# Patient Record
Sex: Female | Born: 1935 | Race: White | Hispanic: No | Marital: Single | Smoking: Former smoker
Health system: Southern US, Community
[De-identification: ages and names within clinical notes are randomized; demographics above are authoritative.]

## PROBLEM LIST (undated history)

## (undated) DIAGNOSIS — M1990 Unspecified osteoarthritis, unspecified site: Secondary | ICD-10-CM

## (undated) DIAGNOSIS — F0280 Dementia in other diseases classified elsewhere without behavioral disturbance: Secondary | ICD-10-CM

## (undated) DIAGNOSIS — G301 Alzheimer's disease with late onset: Secondary | ICD-10-CM

## (undated) DIAGNOSIS — G2581 Restless legs syndrome: Secondary | ICD-10-CM

## (undated) DIAGNOSIS — I10 Essential (primary) hypertension: Secondary | ICD-10-CM

## (undated) DIAGNOSIS — D46Z Other myelodysplastic syndromes: Secondary | ICD-10-CM

## (undated) DIAGNOSIS — K219 Gastro-esophageal reflux disease without esophagitis: Secondary | ICD-10-CM

## (undated) DIAGNOSIS — F319 Bipolar disorder, unspecified: Secondary | ICD-10-CM

## (undated) DIAGNOSIS — D61818 Other pancytopenia: Secondary | ICD-10-CM

---

## 2018-05-22 ENCOUNTER — Other Ambulatory Visit: Payer: Self-pay

## 2018-05-22 ENCOUNTER — Encounter: Payer: Self-pay | Admitting: Emergency Medicine

## 2018-05-22 ENCOUNTER — Emergency Department
Admission: EM | Admit: 2018-05-22 | Discharge: 2018-05-22 | Disposition: A | Discharge status: Home / Self Care | Payer: Medicare Other | Attending: Emergency Medicine | Admitting: Emergency Medicine

## 2018-05-22 DIAGNOSIS — R7989 Other specified abnormal findings of blood chemistry: Secondary | ICD-10-CM | POA: Diagnosis present

## 2018-05-22 DIAGNOSIS — I10 Essential (primary) hypertension: Secondary | ICD-10-CM | POA: Insufficient documentation

## 2018-05-22 DIAGNOSIS — D649 Anemia, unspecified: Secondary | ICD-10-CM | POA: Insufficient documentation

## 2018-05-22 DIAGNOSIS — Z79899 Other long term (current) drug therapy: Secondary | ICD-10-CM | POA: Diagnosis not present

## 2018-05-22 DIAGNOSIS — Z87891 Personal history of nicotine dependence: Secondary | ICD-10-CM | POA: Diagnosis not present

## 2018-05-22 DIAGNOSIS — G309 Alzheimer's disease, unspecified: Secondary | ICD-10-CM | POA: Insufficient documentation

## 2018-05-22 HISTORY — DX: Other pancytopenia: D61.818

## 2018-05-22 HISTORY — DX: Essential (primary) hypertension: I10

## 2018-05-22 HISTORY — DX: Bipolar disorder, unspecified: F31.9

## 2018-05-22 HISTORY — DX: Unspecified osteoarthritis, unspecified site: M19.90

## 2018-05-22 HISTORY — DX: Gastro-esophageal reflux disease without esophagitis: K21.9

## 2018-05-22 HISTORY — DX: Alzheimer's disease with late onset: G30.1

## 2018-05-22 HISTORY — DX: Restless legs syndrome: G25.81

## 2018-05-22 HISTORY — DX: Dementia in other diseases classified elsewhere, unspecified severity, without behavioral disturbance, psychotic disturbance, mood disturbance, and anxiety: F02.80

## 2018-05-22 HISTORY — DX: Other myelodysplastic syndromes: D46.Z

## 2018-05-22 LAB — COMPREHENSIVE METABOLIC PANEL
ALT: 10 U/L (ref 0–44)
ANION GAP: 8 (ref 5–15)
AST: 14 U/L — ABNORMAL LOW (ref 15–41)
Albumin: 3.5 g/dL (ref 3.5–5.0)
Alkaline Phosphatase: 55 U/L (ref 38–126)
BUN: 22 mg/dL (ref 8–23)
CO2: 22 mmol/L (ref 22–32)
Calcium: 9.2 mg/dL (ref 8.9–10.3)
Chloride: 109 mmol/L (ref 98–111)
Creatinine, Ser: 1.26 mg/dL — ABNORMAL HIGH (ref 0.44–1.00)
GFR, EST AFRICAN AMERICAN: 47 mL/min — AB (ref >60)
GFR, EST NON AFRICAN AMERICAN: 40 mL/min — AB (ref >60)
Glucose, Bld: 154 mg/dL — ABNORMAL HIGH (ref 70–99)
Potassium: 4 mmol/L (ref 3.5–5.1)
Sodium: 139 mmol/L (ref 135–145)
Total Bilirubin: 0.6 mg/dL (ref 0.3–1.2)
Total Protein: 6.1 g/dL — ABNORMAL LOW (ref 6.5–8.1)

## 2018-05-22 LAB — CBC WITH DIFFERENTIAL/PLATELET
Abs Immature Granulocytes: 0.01 10*3/uL (ref 0.00–0.07)
Basophils Absolute: 0 10*3/uL (ref 0.0–0.1)
Basophils Relative: 0 %
EOS ABS: 0 10*3/uL (ref 0.0–0.5)
Eosinophils Relative: 2 %
HCT: 21.4 % — ABNORMAL LOW (ref 36.0–46.0)
Hemoglobin: 6.7 g/dL — ABNORMAL LOW (ref 12.0–15.0)
Immature Granulocytes: 1 %
Lymphocytes Relative: 23 %
Lymphs Abs: 0.4 10*3/uL — ABNORMAL LOW (ref 0.7–4.0)
MCH: 37 pg — ABNORMAL HIGH (ref 26.0–34.0)
MCHC: 31.3 g/dL (ref 30.0–36.0)
MCV: 118.2 fL — ABNORMAL HIGH (ref 80.0–100.0)
Monocytes Absolute: 0.2 10*3/uL (ref 0.1–1.0)
Monocytes Relative: 9 %
NRBC: 0 % (ref 0.0–0.2)
Neutro Abs: 1.2 10*3/uL — ABNORMAL LOW (ref 1.7–7.7)
Neutrophils Relative %: 65 %
Platelets: 94 10*3/uL — ABNORMAL LOW (ref 150–400)
RBC: 1.81 MIL/uL — ABNORMAL LOW (ref 3.87–5.11)
RDW: 17.3 % — ABNORMAL HIGH (ref 11.5–15.5)
WBC: 1.9 10*3/uL — ABNORMAL LOW (ref 4.0–10.5)

## 2018-05-22 LAB — APTT: APTT: 38 s — AB (ref 24–36)

## 2018-05-22 LAB — PROTIME-INR
INR: 0.96
Prothrombin Time: 12.7 seconds (ref 11.4–15.2)

## 2018-05-22 LAB — PREPARE RBC (CROSSMATCH)

## 2018-05-22 LAB — ABO/RH: ABO/RH(D): O POS

## 2018-05-22 LAB — TROPONIN I: Troponin I: <0.03 ng/mL (ref <0.03)

## 2018-05-22 MED ORDER — SODIUM CHLORIDE 0.9 % IV SOLN
10.0000 mL/h | Freq: Once | INTRAVENOUS | Status: AC
  Administered 2018-05-22: 10 mL/h via INTRAVENOUS

## 2018-05-22 NOTE — ED Notes (Signed)
Pt assisted to toilet by another RN. Pt tolerated well.

## 2018-05-22 NOTE — ED Notes (Signed)
Pt tolerating blood transfusion well at this time, no reports of rash, itching or shortness of breath, lungs clear, family at bedside.  Continue to monitor.

## 2018-05-22 NOTE — ED Provider Notes (Signed)
[facility] Emergency Department Provider Note   ____________________________________________    I have reviewed the triage vital signs and the nursing notes.   HISTORY  Chief Complaint Abnormal Lab (Hemoglobin 6.5)     HPI Holly Novak is a 82 y.o. female with a history of Alzheimer's dementia, pancytopenia who was sent from facility for evaluation of low hemoglobin.  Patient reports overall she feels well.  She denies chest pain shortness of breath.  No abdominal pain.  She notes stools have been slightly darker than usual.  She is not on blood thinners per MAR.  Denies nausea or vomiting.  No falls or trauma.  No old records available at this time   Past Medical History:  Diagnosis Date  . Arthritis   . Bipolar 1 disorder ([place])   . GERD (gastroesophageal reflux disease)   . High grade myelodysplastic syndrome ([place])   . Hypertension   . Late onset Alzheimer disease ([place])   . Pancytopenia ([place])   . Restless leg syndrome     There are no active problems to display for this patient.   History reviewed. No pertinent surgical history.  Prior to Admission medications   Medication Sig Start Date End Date Taking? Authorizing Provider  acetaminophen (TYLENOL) 500 MG tablet Take 500 mg by mouth every 8 (eight) hours as needed (for pain in knees).   Yes [provider]  alum & mag hydroxide-simeth (MAALOX PLUS) 400-400-40 MG/5ML suspension Take 30 mLs by mouth as needed for indigestion.   Yes [provider]  ammonium lactate (LAC-HYDRIN) 12 % lotion Apply 1 application topically as needed for dry skin. Bilateral legs every day   Yes [provider]  bisacodyl (FLEET) 10 MG/30ML ENEM Place 10 mg rectally once.   Yes [provider]  bismuth subsalicylate (PEPTO BISMOL) 262 MG/15ML suspension Take 15 mLs by mouth every 6 (six) hours as needed. Take 15 ml by mouth every 2 hours as needed for up to 6 doses in 24  hours   Yes [provider]  calcium carbonate (OSCAL) 1500 (600 Ca) MG TABS tablet Take 600 mg of elemental calcium by mouth 2 (two) times daily with a meal.   Yes [provider]  Cholecalciferol (VITAMIN D3) 50 MCG (2000 UT) TABS Take 1 tablet by mouth daily.   Yes [provider]  Cyanocobalamin (VITAMIN B-12) 2500 MCG SUBL Place 1 tablet under the tongue daily.   Yes [provider]  docusate sodium (COLACE) 100 MG capsule Take 100 mg by mouth 2 (two) times daily.   Yes [provider]  guaiFENesin (ROBITUSSIN) 100 MG/5ML liquid Take 10 mLs by mouth 4 (four) times daily as needed for cough.   Yes [provider]  loperamide (IMODIUM A-D) 2 MG tablet Take 2 mg by mouth 4 (four) times daily as needed for diarrhea or loose stools. Take one tablet by mouth after each loose stool as needed for up to 4 doses in 12 hours   Yes [provider]  LORazepam (ATIVAN) 0.5 MG tablet Take 0.5 mg by mouth at bedtime. As needed for insomnia   Yes [provider]  LORazepam (ATIVAN) 0.5 MG tablet Take 0.5 mg by mouth 2 (two) times daily.   Yes [provider]  magnesium hydroxide (MILK OF MAGNESIA) 400 MG/5ML suspension Take 30 mLs by mouth daily as needed for mild constipation.   Yes [provider]  Magnesium Oxide 250 MG TABS Take  250 mg by mouth daily.   Yes [provider]  Melatonin 3 MG TABS Take 3 mg by mouth at bedtime.   Yes [provider]  metoprolol tartrate (LOPRESSOR) 50 MG tablet Take 50 mg by mouth 2 (two) times daily.   Yes [provider]  OLANZapine (ZYPREXA) 10 MG tablet Take 10 mg by mouth at bedtime. Take one tablet (with one 20 mg tablet for total 30 mg) by mouth at bedtime   Yes [provider]  OLANZapine (ZYPREXA) 20 MG tablet Take 20 mg by mouth at bedtime. Take one tablet (20 mg with 10 mg tablet for total 30 mg) by mouth at bedtime   Yes [provider]   omeprazole (PRILOSEC) 40 MG capsule Take 40 mg by mouth daily. Take one capsule by mouth 30 minutes before breakfast (may open capsule and sprinkle contents, do not crush or chew)   Yes [provider]  rOPINIRole (REQUIP) 0.5 MG tablet Take 0.5 mg by mouth at bedtime.   Yes [provider]  Skin Protectants, Misc. (EUCERIN) cream Apply 1 application topically as needed for dry skin. Apply to both legs daily   Yes [provider]  sodium phosphate (FLEET) 7-19 GM/118ML ENEM Place 1 enema rectally once.   Yes [provider]     Allergies Patient has no known allergies.  No family history on file.  Social History Social History   Tobacco Use  . Smoking status: Former Research scientist (life sciences)  . Smokeless tobacco: Never Used  Substance Use Topics  . Alcohol use: Not Currently  . Drug use: Not Currently    Review of Systems  Constitutional: No fever/chills Eyes: No visual changes.  ENT: No sore throat. Cardiovascular: Denies chest pain. Respiratory: Denies shortness of breath. Gastrointestinal: As above Genitourinary: Negative for dysuria. Musculoskeletal: Negative for back pain. Skin: Negative for rash. Neurological: Negative for headaches    ____________________________________________   PHYSICAL EXAM:  VITAL SIGNS: ED Triage Vitals  Enc Vitals Group     BP 05/22/18 0844 (!) 143/53     Pulse Rate 05/22/18 0900 71     Resp 05/22/18 0844 18     Temp 05/22/18 0844 98.5 F (36.9 C)     Temp Source 05/22/18 0844 Oral     SpO2 05/22/18 0844 97 %     Weight 05/22/18 0842 63.5 kg (140 lb)     Height 05/22/18 0842 1.575 m (5\' 2" )     Head Circumference --      Peak Flow --      Pain Score 05/22/18 0842 0     Pain Loc --      Pain Edu? --      Excl. in [place]? --     Constitutional: Alert No acute distress. Pleasant and interactive Eyes: Conjunctivae are normal.   Nose: No congestion/rhinnorhea. Mouth/Throat: Mucous membranes are moist.     Cardiovascular: Normal rate, regular rhythm. Grossly normal heart sounds.  Good peripheral circulation. Respiratory: Normal respiratory effort.  No retractions. Lungs CTAB. Gastrointestinal: Soft and nontender. No distention.  No CVA tenderness.  Guaiac negative brown stool  Musculoskeletal: .  Warm and well perfused Neurologic:  Normal speech and language. No gross focal neurologic deficits are appreciated.  Skin:  Skin is warm, dry and intact. No rash noted. Psychiatric: Mood and affect are normal. Speech and behavior are normal.  ____________________________________________   LABS (all labs ordered are listed, but only abnormal results are displayed)  Labs Reviewed  APTT - Abnormal; Notable for the following components:      Result Value   aPTT 38 (*)    All other components within normal limits  COMPREHENSIVE METABOLIC PANEL - Abnormal; Notable for the following components:   Glucose, Bld 154 (*)    Creatinine, Ser 1.26 (*)    Total Protein 6.1 (*)    AST 14 (*)    GFR calc non Af Amer 40 (*)    GFR calc Af Amer 47 (*)    All other components within normal limits  CBC WITH DIFFERENTIAL/PLATELET - Abnormal; Notable for the following components:   WBC 1.9 (*)    RBC 1.81 (*)    Hemoglobin 6.7 (*)    HCT 21.4 (*)    MCV 118.2 (*)    MCH 37.0 (*)    RDW 17.3 (*)    Platelets 94 (*)    Neutro Abs 1.2 (*)    Lymphs Abs 0.4 (*)    All other components within normal limits  PROTIME-INR  TROPONIN I  TYPE AND SCREEN  PREPARE RBC (CROSSMATCH)  ABO/RH   ____________________________________________  EKG  ED ECG REPORT I, Lavonia Drafts, the attending physician, personally viewed and interpreted this ECG.  Date: 05/22/2018  Rhythm: normal sinus rhythm QRS Axis: normal Intervals: normal ST/T Wave abnormalities: normal Narrative Interpretation: no evidence of acute  ischemia  ____________________________________________  RADIOLOGY  None ____________________________________________   PROCEDURES  Procedure(s) performed: No  Procedures   Critical Care performed: No ____________________________________________   INITIAL IMPRESSION / ASSESSMENT AND PLAN / ED COURSE  Pertinent labs & imaging results that were available during my care of the patient were reviewed by me and considered in my medical decision making (see chart for details).  Patient sent to the emergency department for low hemoglobin, she is guaiac negative, this is likely related to her myelodysplastic disorder, she has low platelets as well as low white blood cells.  She is in no distress.  Otherwise lab work is unremarkable  Reportedly daughter is on her way, and apparently the patient receives care for her pancytopenia at [facility].  Discussed with Dr. Mike Gip of heme-onc, she states more information is necessary given no old records  Will contact [facility].  Spoke with Dr. Gita Kudo of hematology at [facility], patient's baseline hemoglobin is around 8.28.3.  She is transferring hematology care locally.  We will give a unit of blood here but further work-up can be performed as an outpatient    ____________________________________________   FINAL CLINICAL IMPRESSION(S) / ED DIAGNOSES  Final diagnoses:  Symptomatic anemia        Note:  This document was prepared using Dragon voice recognition software and may include unintentional dictation errors.    Lavonia Drafts, MD 05/22/18 [phone]

## 2018-05-22 NOTE — Discharge Instructions (Addendum)
PLease follow-up with your regular doctor in the next few days.  Please return for any further problems.

## 2018-05-22 NOTE — ED Triage Notes (Signed)
Pt arrived via EMS from [facility] with reports of low hemoglobin of 6.5 from lab work done on 12/18.  Pt is pale on arrival, however, is alert and talking. Pt has hx of dementia and  Pancytopenia.  Pt denies any pain at this time.

## 2018-05-22 NOTE — ED Notes (Signed)
DC instructions discussed with caregiver at [facility] and with daughter who is POA, instructed to follow up with hematology/oncology.

## 2018-05-22 NOTE — ED Notes (Signed)
Pt resting quietly with eyes closed at this time, awaiting disposition plans.

## 2018-05-22 NOTE — ED Notes (Signed)
Blood transfusion almost completed, informed Dr. Cinda Quest, daughter Ashok Norris who is at the bedside will transport the patient back to [facility].

## 2018-05-23 LAB — TYPE AND SCREEN
ABO/RH(D): O POS
Antibody Screen: NEGATIVE
Unit division: 0

## 2018-05-23 LAB — BPAM RBC
BLOOD PRODUCT EXPIRATION DATE: 202001112359
ISSUE DATE / TIME: 201912201452
Unit Type and Rh: 5100

## 2018-10-26 ENCOUNTER — Encounter: Payer: Self-pay | Admitting: Intensive Care

## 2018-10-26 ENCOUNTER — Inpatient Hospital Stay: Payer: Medicare Other

## 2018-10-26 ENCOUNTER — Inpatient Hospital Stay
Admission: EM | Admit: 2018-10-26 | Discharge: 2018-12-02 | DRG: 388 | Disposition: E | Payer: Medicare Other | Attending: Internal Medicine | Admitting: Internal Medicine

## 2018-10-26 ENCOUNTER — Other Ambulatory Visit: Payer: Self-pay

## 2018-10-26 DIAGNOSIS — F319 Bipolar disorder, unspecified: Secondary | ICD-10-CM | POA: Diagnosis present

## 2018-10-26 DIAGNOSIS — E871 Hypo-osmolality and hyponatremia: Secondary | ICD-10-CM

## 2018-10-26 DIAGNOSIS — D631 Anemia in chronic kidney disease: Secondary | ICD-10-CM | POA: Diagnosis present

## 2018-10-26 DIAGNOSIS — Z20828 Contact with and (suspected) exposure to other viral communicable diseases: Secondary | ICD-10-CM | POA: Diagnosis present

## 2018-10-26 DIAGNOSIS — K219 Gastro-esophageal reflux disease without esophagitis: Secondary | ICD-10-CM | POA: Diagnosis present

## 2018-10-26 DIAGNOSIS — K567 Ileus, unspecified: Secondary | ICD-10-CM | POA: Diagnosis present

## 2018-10-26 DIAGNOSIS — Z4659 Encounter for fitting and adjustment of other gastrointestinal appliance and device: Secondary | ICD-10-CM

## 2018-10-26 DIAGNOSIS — N189 Chronic kidney disease, unspecified: Secondary | ICD-10-CM | POA: Diagnosis present

## 2018-10-26 DIAGNOSIS — M1990 Unspecified osteoarthritis, unspecified site: Secondary | ICD-10-CM | POA: Diagnosis present

## 2018-10-26 DIAGNOSIS — F0280 Dementia in other diseases classified elsewhere without behavioral disturbance: Secondary | ICD-10-CM | POA: Diagnosis present

## 2018-10-26 DIAGNOSIS — Z66 Do not resuscitate: Secondary | ICD-10-CM | POA: Diagnosis not present

## 2018-10-26 DIAGNOSIS — Z87891 Personal history of nicotine dependence: Secondary | ICD-10-CM | POA: Diagnosis not present

## 2018-10-26 DIAGNOSIS — E876 Hypokalemia: Secondary | ICD-10-CM | POA: Diagnosis not present

## 2018-10-26 DIAGNOSIS — E872 Acidosis: Secondary | ICD-10-CM | POA: Diagnosis not present

## 2018-10-26 DIAGNOSIS — D61818 Other pancytopenia: Secondary | ICD-10-CM | POA: Diagnosis present

## 2018-10-26 DIAGNOSIS — J90 Pleural effusion, not elsewhere classified: Secondary | ICD-10-CM | POA: Diagnosis not present

## 2018-10-26 DIAGNOSIS — R112 Nausea with vomiting, unspecified: Secondary | ICD-10-CM

## 2018-10-26 DIAGNOSIS — K565 Intestinal adhesions [bands], unspecified as to partial versus complete obstruction: Principal | ICD-10-CM | POA: Diagnosis present

## 2018-10-26 DIAGNOSIS — J9601 Acute respiratory failure with hypoxia: Secondary | ICD-10-CM | POA: Diagnosis not present

## 2018-10-26 DIAGNOSIS — R0602 Shortness of breath: Secondary | ICD-10-CM

## 2018-10-26 DIAGNOSIS — K56609 Unspecified intestinal obstruction, unspecified as to partial versus complete obstruction: Secondary | ICD-10-CM | POA: Diagnosis not present

## 2018-10-26 DIAGNOSIS — N183 Chronic kidney disease, stage 3 (moderate): Secondary | ICD-10-CM | POA: Diagnosis present

## 2018-10-26 DIAGNOSIS — R059 Cough, unspecified: Secondary | ICD-10-CM

## 2018-10-26 DIAGNOSIS — J690 Pneumonitis due to inhalation of food and vomit: Secondary | ICD-10-CM | POA: Diagnosis not present

## 2018-10-26 DIAGNOSIS — D469 Myelodysplastic syndrome, unspecified: Secondary | ICD-10-CM | POA: Diagnosis present

## 2018-10-26 DIAGNOSIS — N179 Acute kidney failure, unspecified: Secondary | ICD-10-CM | POA: Diagnosis present

## 2018-10-26 DIAGNOSIS — R627 Adult failure to thrive: Secondary | ICD-10-CM | POA: Diagnosis present

## 2018-10-26 DIAGNOSIS — Z6821 Body mass index (BMI) 21.0-21.9, adult: Secondary | ICD-10-CM

## 2018-10-26 DIAGNOSIS — I129 Hypertensive chronic kidney disease with stage 1 through stage 4 chronic kidney disease, or unspecified chronic kidney disease: Secondary | ICD-10-CM | POA: Diagnosis present

## 2018-10-26 DIAGNOSIS — E860 Dehydration: Secondary | ICD-10-CM | POA: Diagnosis present

## 2018-10-26 DIAGNOSIS — G301 Alzheimer's disease with late onset: Secondary | ICD-10-CM | POA: Diagnosis present

## 2018-10-26 DIAGNOSIS — Z515 Encounter for palliative care: Secondary | ICD-10-CM | POA: Diagnosis not present

## 2018-10-26 DIAGNOSIS — J9811 Atelectasis: Secondary | ICD-10-CM | POA: Diagnosis not present

## 2018-10-26 DIAGNOSIS — R05 Cough: Secondary | ICD-10-CM

## 2018-10-26 DIAGNOSIS — J9600 Acute respiratory failure, unspecified whether with hypoxia or hypercapnia: Secondary | ICD-10-CM

## 2018-10-26 DIAGNOSIS — G2581 Restless legs syndrome: Secondary | ICD-10-CM | POA: Diagnosis present

## 2018-10-26 DIAGNOSIS — R109 Unspecified abdominal pain: Secondary | ICD-10-CM

## 2018-10-26 LAB — URINALYSIS, COMPLETE (UACMP) WITH MICROSCOPIC
Bacteria, UA: NONE SEEN
Bilirubin Urine: NEGATIVE
Glucose, UA: NEGATIVE mg/dL
Hgb urine dipstick: NEGATIVE
Ketones, ur: NEGATIVE mg/dL
Leukocytes,Ua: NEGATIVE
Nitrite: NEGATIVE
Protein, ur: NEGATIVE mg/dL
Specific Gravity, Urine: 1.019 (ref 1.005–1.030)
Squamous Epithelial / LPF: NONE SEEN (ref 0–5)
pH: 5 (ref 5.0–8.0)

## 2018-10-26 LAB — COMPREHENSIVE METABOLIC PANEL
ALT: 9 U/L (ref 0–44)
AST: 17 U/L (ref 15–41)
Albumin: 4.6 g/dL (ref 3.5–5.0)
Alkaline Phosphatase: 50 U/L (ref 38–126)
Anion gap: 18 — ABNORMAL HIGH (ref 5–15)
BUN: 67 mg/dL — ABNORMAL HIGH (ref 8–23)
CO2: 27 mmol/L (ref 22–32)
Calcium: 9.2 mg/dL (ref 8.9–10.3)
Chloride: 80 mmol/L — ABNORMAL LOW (ref 98–111)
Creatinine, Ser: 3.33 mg/dL — ABNORMAL HIGH (ref 0.44–1.00)
GFR calc Af Amer: 14 mL/min — ABNORMAL LOW (ref >60)
GFR calc non Af Amer: 12 mL/min — ABNORMAL LOW (ref >60)
Glucose, Bld: 176 mg/dL — ABNORMAL HIGH (ref 70–99)
Potassium: 4.3 mmol/L (ref 3.5–5.1)
Sodium: 125 mmol/L — ABNORMAL LOW (ref 135–145)
Total Bilirubin: 1.1 mg/dL (ref 0.3–1.2)
Total Protein: 7.8 g/dL (ref 6.5–8.1)

## 2018-10-26 LAB — GLUCOSE, CAPILLARY: Glucose-Capillary: 165 mg/dL — ABNORMAL HIGH (ref 70–99)

## 2018-10-26 LAB — CBC
HCT: 23.6 % — ABNORMAL LOW (ref 36.0–46.0)
Hemoglobin: 8.4 g/dL — ABNORMAL LOW (ref 12.0–15.0)
MCH: 38 pg — ABNORMAL HIGH (ref 26.0–34.0)
MCHC: 35.6 g/dL (ref 30.0–36.0)
MCV: 106.8 fL — ABNORMAL HIGH (ref 80.0–100.0)
Platelets: 159 10*3/uL (ref 150–400)
RBC: 2.21 MIL/uL — ABNORMAL LOW (ref 3.87–5.11)
RDW: 15.9 % — ABNORMAL HIGH (ref 11.5–15.5)
WBC: 8.6 10*3/uL (ref 4.0–10.5)
nRBC: 0 % (ref 0.0–0.2)

## 2018-10-26 LAB — SARS CORONAVIRUS 2 BY RT PCR (HOSPITAL ORDER, PERFORMED IN ~~LOC~~ HOSPITAL LAB): SARS Coronavirus 2: NEGATIVE

## 2018-10-26 LAB — LIPASE, BLOOD: Lipase: 23 U/L (ref 11–51)

## 2018-10-26 LAB — TSH: TSH: 1.854 u[IU]/mL (ref 0.350–4.500)

## 2018-10-26 MED ORDER — MELATONIN 5 MG PO TABS
2.5000 mg | ORAL_TABLET | Freq: Every day | ORAL | Status: DC
  Administered 2018-10-26: 2.5 mg via ORAL
  Filled 2018-10-26 (×6): qty 0.5

## 2018-10-26 MED ORDER — MAGNESIUM OXIDE 400 (241.3 MG) MG PO TABS
200.0000 mg | ORAL_TABLET | Freq: Every day | ORAL | Status: DC
  Administered 2018-10-27: 11:00:00 200 mg via ORAL
  Filled 2018-10-26: qty 1

## 2018-10-26 MED ORDER — OLANZAPINE 10 MG PO TABS
20.0000 mg | ORAL_TABLET | Freq: Every day | ORAL | Status: DC
  Administered 2018-10-26: 20 mg via ORAL
  Filled 2018-10-26 (×3): qty 2

## 2018-10-26 MED ORDER — ONDANSETRON HCL 4 MG/2ML IJ SOLN
4.0000 mg | Freq: Four times a day (QID) | INTRAMUSCULAR | Status: DC | PRN
  Administered 2018-10-26 – 2018-10-27 (×2): 4 mg via INTRAVENOUS
  Filled 2018-10-26 (×2): qty 2

## 2018-10-26 MED ORDER — BISACODYL 10 MG RE SUPP
10.0000 mg | Freq: Once | RECTAL | Status: AC
  Administered 2018-10-26: 22:00:00 10 mg via RECTAL
  Filled 2018-10-26: qty 1

## 2018-10-26 MED ORDER — ONDANSETRON HCL 4 MG/2ML IJ SOLN
4.0000 mg | Freq: Once | INTRAMUSCULAR | Status: AC
  Administered 2018-10-26: 4 mg via INTRAVENOUS
  Filled 2018-10-26: qty 2

## 2018-10-26 MED ORDER — PANTOPRAZOLE SODIUM 40 MG PO TBEC
40.0000 mg | DELAYED_RELEASE_TABLET | Freq: Every day | ORAL | Status: DC
  Administered 2018-10-27: 40 mg via ORAL
  Filled 2018-10-26: qty 1

## 2018-10-26 MED ORDER — LORAZEPAM 0.5 MG PO TABS: 0.5000 mg | ORAL_TABLET | Freq: Every evening | ORAL | Status: DC | PRN

## 2018-10-26 MED ORDER — FERROUS SULFATE 325 (65 FE) MG PO TABS
325.0000 mg | ORAL_TABLET | Freq: Two times a day (BID) | ORAL | Status: DC
  Administered 2018-10-26 – 2018-10-27 (×2): 325 mg via ORAL
  Filled 2018-10-26 (×2): qty 1

## 2018-10-26 MED ORDER — CALCIUM CARBONATE ANTACID 500 MG PO CHEW
600.0000 mg | CHEWABLE_TABLET | Freq: Two times a day (BID) | ORAL | Status: DC
  Administered 2018-10-27: 10:00:00 600 mg via ORAL
  Filled 2018-10-26: qty 3

## 2018-10-26 MED ORDER — SODIUM CHLORIDE 0.9 % IV SOLN
Freq: Once | INTRAVENOUS | Status: AC
  Administered 2018-10-26: 18:00:00 via INTRAVENOUS

## 2018-10-26 MED ORDER — ACETAMINOPHEN 650 MG RE SUPP
650.0000 mg | Freq: Four times a day (QID) | RECTAL | Status: DC | PRN
  Administered 2018-11-04: 12:00:00 650 mg via RECTAL
  Filled 2018-10-26: qty 1

## 2018-10-26 MED ORDER — SODIUM CHLORIDE 0.9 % IV SOLN
INTRAVENOUS | Status: DC
  Administered 2018-10-26 – 2018-10-29 (×7): via INTRAVENOUS

## 2018-10-26 MED ORDER — ADULT MULTIVITAMIN W/MINERALS CH
1.0000 | ORAL_TABLET | Freq: Every day | ORAL | Status: DC
  Administered 2018-10-27: 11:00:00 1 via ORAL
  Filled 2018-10-26: qty 1

## 2018-10-26 MED ORDER — METOPROLOL TARTRATE 50 MG PO TABS
50.0000 mg | ORAL_TABLET | Freq: Two times a day (BID) | ORAL | Status: DC
  Administered 2018-10-26 – 2018-10-27 (×2): 50 mg via ORAL
  Filled 2018-10-26 (×2): qty 1

## 2018-10-26 MED ORDER — ONDANSETRON HCL 4 MG PO TABS: 4.0000 mg | ORAL_TABLET | Freq: Four times a day (QID) | ORAL | Status: DC | PRN

## 2018-10-26 MED ORDER — ACETAMINOPHEN 325 MG PO TABS: 650.0000 mg | ORAL_TABLET | Freq: Four times a day (QID) | ORAL | Status: DC | PRN

## 2018-10-26 MED ORDER — OLANZAPINE 10 MG PO TABS
10.0000 mg | ORAL_TABLET | Freq: Every day | ORAL | Status: DC
  Administered 2018-10-26: 10 mg via ORAL
  Filled 2018-10-26 (×3): qty 1

## 2018-10-26 MED ORDER — MIRTAZAPINE 15 MG PO TABS
30.0000 mg | ORAL_TABLET | Freq: Every day | ORAL | Status: DC
  Administered 2018-10-26: 22:00:00 30 mg via ORAL
  Filled 2018-10-26: qty 2

## 2018-10-26 MED ORDER — VITAMIN B-1 100 MG PO TABS
100.0000 mg | ORAL_TABLET | Freq: Every day | ORAL | Status: DC
  Administered 2018-10-26 – 2018-10-27 (×2): 100 mg via ORAL
  Filled 2018-10-26 (×2): qty 1

## 2018-10-26 MED ORDER — ROPINIROLE HCL 0.25 MG PO TABS
0.5000 mg | ORAL_TABLET | Freq: Every day | ORAL | Status: DC
  Administered 2018-10-26: 22:00:00 0.5 mg via ORAL
  Filled 2018-10-26 (×7): qty 2

## 2018-10-26 MED ORDER — DOCUSATE SODIUM 100 MG PO CAPS
100.0000 mg | ORAL_CAPSULE | Freq: Two times a day (BID) | ORAL | Status: DC
  Administered 2018-10-26 – 2018-10-27 (×2): 100 mg via ORAL
  Filled 2018-10-26 (×2): qty 1

## 2018-10-26 MED ORDER — FOLIC ACID 1 MG PO TABS
1.0000 mg | ORAL_TABLET | Freq: Every day | ORAL | Status: DC
  Administered 2018-10-26 – 2018-10-27 (×2): 1 mg via ORAL
  Filled 2018-10-26 (×2): qty 1

## 2018-10-26 MED ORDER — POLYETHYLENE GLYCOL 3350 17 G PO PACK: 17.0000 g | PACK | Freq: Every day | ORAL | Status: DC | PRN

## 2018-10-26 NOTE — ED Provider Notes (Signed)
[facility] Emergency Department Provider Note       Time seen: ----------------------------------------- 5:47 PM on 10/31/2018 -----------------------------------------   I have reviewed the triage vital signs and the nursing notes.  HISTORY   Chief Complaint Emesis    HPI Holly Novak is a 83 y.o. female with a history of arthritis, bipolar disorder, GERD, myelodysplasia, hypertension, Alzheimer's, pancytopenia who presents to the ED for days of nausea and vomiting.  She is from an assisted living facility.  She arrives alert and oriented x3.  She denies any pain on arrival.  Past Medical History:  Diagnosis Date  . Arthritis   . Bipolar 1 disorder ([place])   . GERD (gastroesophageal reflux disease)   . High grade myelodysplastic syndrome ([place])   . Hypertension   . Late onset Alzheimer disease ([place])   . Pancytopenia ([place])   . Restless leg syndrome     There are no active problems to display for this patient.   History reviewed. No pertinent surgical history.  Allergies Patient has no known allergies.  Social History Social History   Tobacco Use  . Smoking status: Former Research scientist (life sciences)  . Smokeless tobacco: Never Used  Substance Use Topics  . Alcohol use: Not Currently  . Drug use: Not Currently   Review of Systems Constitutional: Negative for fever. Cardiovascular: Negative for chest pain. Respiratory: Negative for shortness of breath. Gastrointestinal: Negative for abdominal pain, positive for nausea and vomiting, poor appetite Musculoskeletal: Negative for back pain. Skin: Negative for rash. Neurological: Negative for headaches, focal weakness or numbness.  All systems negative/normal/unremarkable except as stated in the HPI  ____________________________________________   PHYSICAL EXAM:  VITAL SIGNS: ED Triage Vitals  Enc Vitals Group     BP 10/09/2018 1629 (!) 149/60     Pulse Rate 10/19/2018 1629 (!) 112     Resp 10/25/2018  1629 16     Temp 10/03/2018 1629 99 F (37.2 C)     Temp Source 10/31/2018 1629 Oral     SpO2 11/01/2018 1624 98 %     Weight 10/17/2018 1632 160 lb (72.6 kg)     Height 10/04/2018 1632 5\' 5"  (1.651 m)     Head Circumference --      Peak Flow --      Pain Score --      Pain Loc --      Pain Edu? --      Excl. in [place]? --    Constitutional: Alert, chronically ill-appearing, no distress. Eyes: Conjunctivae are pale ENT      Head: Normocephalic and atraumatic.      Nose: No congestion/rhinnorhea.      Mouth/Throat: Mucous membranes are moist.      Neck: No stridor. Cardiovascular: Rapid rate, regular rhythm. No murmurs, rubs, or gallops. Respiratory: Normal respiratory effort without tachypnea nor retractions. Breath sounds are clear and equal bilaterally. No wheezes/rales/rhonchi. Gastrointestinal: Soft and nontender. Normal bowel sounds Musculoskeletal: Nontender with normal range of motion in extremities. No lower extremity tenderness nor edema. Neurologic:  No gross focal neurologic deficits are appreciated.  Skin:  Skin is warm, dry and intact.  Pallor is noted Psychiatric: Flat affect ___________________________________________  ED COURSE:  As part of my medical decision making, I reviewed the following data within the [place] History obtained from family if available, nursing notes, old chart and ekg, as well as notes from prior ED visits. Patient presented for nausea vomiting with poor appetite, we will assess with  labs and imaging as indicated at this time.   Procedures  Holly Novak was evaluated in Emergency Department on 10/24/2018 for the symptoms described in the history of present illness. She was evaluated in the context of the global COVID-19 pandemic, which necessitated consideration that the patient might be at risk for infection with the SARS-CoV-2 virus that causes COVID-19. Institutional protocols and algorithms that pertain to the evaluation of  patients at risk for COVID-19 are in a state of rapid change based on information released by regulatory bodies including the CDC and federal and state organizations. These policies and algorithms were followed during the patient's care in the ED.  ____________________________________________   LABS (pertinent positives/negatives)  Labs Reviewed  GLUCOSE, CAPILLARY - Abnormal; Notable for the following components:      Result Value   Glucose-Capillary 165 (*)    All other components within normal limits  COMPREHENSIVE METABOLIC PANEL - Abnormal; Notable for the following components:   Sodium 125 (*)    Chloride 80 (*)    Glucose, Bld 176 (*)    BUN 67 (*)    Creatinine, Ser 3.33 (*)    GFR calc non Af Amer 12 (*)    GFR calc Af Amer 14 (*)    Anion gap 18 (*)    All other components within normal limits  CBC - Abnormal; Notable for the following components:   RBC 2.21 (*)    Hemoglobin 8.4 (*)    HCT 23.6 (*)    MCV 106.8 (*)    MCH 38.0 (*)    RDW 15.9 (*)    All other components within normal limits  SARS CORONAVIRUS 2 (HOSPITAL ORDER, [place] [facility])  LIPASE, BLOOD  URINALYSIS, COMPLETE (UACMP) WITH MICROSCOPIC   CRITICAL CARE Performed by: Laurence Aly   Total critical care time: 30 minutes  Critical care time was exclusive of separately billable procedures and treating other patients.  Critical care was necessary to treat or prevent imminent or life-threatening deterioration.  Critical care was time spent personally by me on the following activities: development of treatment plan with patient and/or surrogate as well as nursing, discussions with consultants, evaluation of patient's response to treatment, examination of patient, obtaining history from patient or surrogate, ordering and performing treatments and interventions, ordering and review of laboratory studies, ordering and review of radiographic studies, pulse oximetry and  re-evaluation of patient's condition. ___________________________________________   DIFFERENTIAL DIAGNOSIS   Gastritis, gastroenteritis, dehydration, electrolyte abnormality, occult infection  FINAL ASSESSMENT AND PLAN  Vomiting, acute on chronic renal failure, chronic anemia, hyponatremia   Plan: The patient had presented for vomiting and poor p.o. intake. Patient's labs reveal acute on chronic abnormalities including acute on chronic renal failure, anemia and hyponatremia.  She was not having any pain, no imaging was performed.  She was given IV fluids and antiemetics.  I will discuss with the hospitalist for admission.   Laurence Aly, MD    Note: This note was generated in part or whole with voice recognition software. Voice recognition is usually quite accurate but there are transcription errors that can and very often do occur. I apologize for any typographical errors that were not detected and corrected.     Earleen Newport, MD 10/02/2018 [phone]

## 2018-10-26 NOTE — ED Notes (Addendum)
Bladder scan shows >557mL of retained urine

## 2018-10-26 NOTE — ED Triage Notes (Signed)
Pt presents w/ 3 day hx of N/V. Pt is from asst. Living facility. Pt reported decreased oral intake of fluids and food. Pt is A & O x 3.

## 2018-10-26 NOTE — ED Notes (Signed)
ED TO INPATIENT HANDOFF REPORT  ED Nurse Name and Phone #: [phone]  S Name/Age/Gender Holly Novak 83 y.o. female Room/Bed: ED14A/ED14A  Code Status   Code Status: Full Code  Home/SNF/Other Home Patient oriented to: self, place, time and situation Is this baseline? Yes   Triage Complete: Triage complete  Chief Complaint nausea and vomiting  Triage Note Pt presents w/ 3 day hx of N/V. Pt is from asst. Living facility. Pt reported decreased oral intake of fluids and food. Pt is A & O x 3.    Allergies No Known Allergies  Level of Care/Admitting Diagnosis ED Disposition    ED Disposition Condition [facility] Area: [place] [[id number]]  Level of Care: Med-Surg [16]  Covid Evaluation: N/A  Diagnosis: Acute on chronic renal failure [facility]) [[id number]]  Admitting Physician: Henreitta Leber [[id number]]  Attending Physician: Henreitta Leber [[id number]]  Estimated length of stay: past midnight tomorrow  Certification:: I certify this patient will need inpatient services for at least 2 midnights  PT Class (Do Not Modify): Inpatient [101]  PT Acc Code (Do Not Modify): Private [1]       B Medical/Surgery History Past Medical History:  Diagnosis Date  . Arthritis   . Bipolar 1 disorder ([place])   . GERD (gastroesophageal reflux disease)   . High grade myelodysplastic syndrome ([place])   . Hypertension   . Late onset Alzheimer disease ([place])   . Pancytopenia ([place])   . Restless leg syndrome    History reviewed. No pertinent surgical history.   A IV Location/Drains/Wounds Patient Lines/Drains/Airways Status   Active Line/Drains/Airways    Name:   Placement date:   Placement time:   Site:   Days:   Peripheral IV 10/22/2018 Left Antecubital   10/18/2018    1816    Antecubital   less than 1          Intake/Output Last 24 hours No intake or output data in the 24 hours ending 10/06/2018 2007  Labs/Imaging Results for orders placed or  performed during the hospital encounter of 10/23/2018 (from the past 48 hour(s))  Glucose, capillary     Status: Abnormal   Collection Time: 10/03/2018  4:33 PM  Result Value Ref Range   Glucose-Capillary 165 (H) 70 - 99 mg/dL   Comment 1 Notify RN    Comment 2 Document in Chart   Lipase, blood     Status: None   Collection Time: 10/16/2018  4:38 PM  Result Value Ref Range   Lipase 23 11 - 51 U/L    Comment: Performed at [facility], [address] [place], [place] [address]  Comprehensive metabolic panel     Status: Abnormal   Collection Time: 10/23/2018  4:38 PM  Result Value Ref Range   Sodium 125 (L) 135 - 145 mmol/L   Potassium 4.3 3.5 - 5.1 mmol/L   Chloride 80 (L) 98 - 111 mmol/L   CO2 27 22 - 32 mmol/L   Glucose, Bld 176 (H) 70 - 99 mg/dL   BUN 67 (H) 8 - 23 mg/dL   Creatinine, Ser 3.33 (H) 0.44 - 1.00 mg/dL   Calcium 9.2 8.9 - 10.3 mg/dL   Total Protein 7.8 6.5 - 8.1 g/dL   Albumin 4.6 3.5 - 5.0 g/dL   AST 17 15 - 41 U/L   ALT 9 0 - 44 U/L   Alkaline Phosphatase 50 38 - 126 U/L   Total  Bilirubin 1.1 0.3 - 1.2 mg/dL   GFR calc non Af Amer 12 (L) >60 mL/min   GFR calc Af Amer 14 (L) >60 mL/min   Anion gap 18 (H) 5 - 15    Comment: Performed at [facility], [address] [place], [place] [address]  CBC     Status: Abnormal   Collection Time: 10/16/2018  4:38 PM  Result Value Ref Range   WBC 8.6 4.0 - 10.5 K/uL   RBC 2.21 (L) 3.87 - 5.11 MIL/uL   Hemoglobin 8.4 (L) 12.0 - 15.0 g/dL   HCT 23.6 (L) 36.0 - 46.0 %   MCV 106.8 (H) 80.0 - 100.0 fL   MCH 38.0 (H) 26.0 - 34.0 pg   MCHC 35.6 30.0 - 36.0 g/dL   RDW 15.9 (H) 11.5 - 15.5 %   Platelets 159 150 - 400 K/uL   nRBC 0.0 0.0 - 0.2 %    Comment: Performed at [facility], [address] [place], [place] [address]  Urinalysis, Complete w Microscopic     Status: Abnormal   Collection Time: 10/09/2018  7:17 PM  Result Value Ref Range   Color, Urine YELLOW (A) YELLOW   APPearance CLEAR (A)  CLEAR   Specific Gravity, Urine 1.019 1.005 - 1.030   pH 5.0 5.0 - 8.0   Glucose, UA NEGATIVE NEGATIVE mg/dL   Hgb urine dipstick NEGATIVE NEGATIVE   Bilirubin Urine NEGATIVE NEGATIVE   Ketones, ur NEGATIVE NEGATIVE mg/dL   Protein, ur NEGATIVE NEGATIVE mg/dL   Nitrite NEGATIVE NEGATIVE   Leukocytes,Ua NEGATIVE NEGATIVE   RBC / HPF 0-5 0 - 5 RBC/hpf   WBC, UA 0-5 0 - 5 WBC/hpf   Bacteria, UA NONE SEEN NONE SEEN   Squamous Epithelial / LPF NONE SEEN 0 - 5   Hyaline Casts, UA PRESENT     Comment: Performed at [facility], [address] [place], [place] [address]   No results found.  Pending Labs Unresulted Labs (From admission, onward)    Start     Ordered   10/27/18 8299  Basic metabolic panel  Tomorrow morning,   STAT     10/13/2018 1955   10/27/18 0500  CBC  Tomorrow morning,   STAT     10/17/2018 1955   10/04/2018 1956  TSH  Once,   STAT     10/27/2018 1955   10/15/2018 1956  Urine culture  Once,   STAT    Question:  Patient immune status  Answer:  Normal   10/17/2018 1955   10/17/2018 1956  Urinalysis, Complete w Microscopic  Once,   STAT     10/16/2018 1955   10/15/2018 1956  Cortisol-am, blood  Once,   STAT     10/31/2018 1955   10/27/2018 1828  SARS Coronavirus 2 (CEPHEID - Performed in [place] hospital lab), Hosp Order  (Asymptomatic Patients Labs)  Once,   STAT    Question:  Rule Out  Answer:  Yes   10/19/2018 1827          Vitals/Pain Today's Vitals   10/04/2018 1629 10/25/2018 1632 10/23/2018 1900 10/09/2018 1930  BP: (!) 149/60  (!) 142/56 (!) 150/63  Pulse: (!) 112  (!) 104 (!) 106  Resp: 16     Temp: 99 F (37.2 C)     TempSrc: Oral     SpO2: 97%  97% 97%  Weight:  72.6 kg    Height:  5\' 5"  (1.651 m)  Isolation Precautions No active isolations  Medications Medications  calcium carbonate (OSCAL) tablet 1,500 mg (has no administration in time range)  docusate sodium (COLACE) capsule 100 mg (has no administration in time range)  ferrous sulfate tablet  325 mg (has no administration in time range)  LORazepam (ATIVAN) tablet 0.5 mg (has no administration in time range)  Magnesium Oxide TABS 250 mg (has no administration in time range)  Melatonin TABS 3 mg (has no administration in time range)  metoprolol tartrate (LOPRESSOR) tablet 50 mg (has no administration in time range)  mirtazapine (REMERON) tablet 30 mg (has no administration in time range)  OLANZapine (ZYPREXA) tablet 10 mg (has no administration in time range)  OLANZapine (ZYPREXA) tablet 20 mg (has no administration in time range)  pantoprazole (PROTONIX) EC tablet 40 mg (has no administration in time range)  rOPINIRole (REQUIP) tablet 0.5 mg (has no administration in time range)  0.9 %  sodium chloride infusion (has no administration in time range)  acetaminophen (TYLENOL) tablet 650 mg (has no administration in time range)    Or  acetaminophen (TYLENOL) suppository 650 mg (has no administration in time range)  polyethylene glycol (MIRALAX / GLYCOLAX) packet 17 g (has no administration in time range)  ondansetron (ZOFRAN) tablet 4 mg (has no administration in time range)    Or  ondansetron (ZOFRAN) injection 4 mg (has no administration in time range)  folic acid (FOLVITE) tablet 1 mg (has no administration in time range)  multivitamin with minerals tablet 1 tablet (has no administration in time range)  thiamine (VITAMIN B-1) tablet 100 mg (has no administration in time range)  0.9 %  sodium chloride infusion ( Intravenous New Bag/Given 10/20/2018 1817)  ondansetron (ZOFRAN) injection 4 mg (4 mg Intravenous Given 10/23/2018 1817)    Mobility walks High fall risk   Focused Assessments  R Recommendations: See Admitting Provider Note  Report given to:   Additional Notes:

## 2018-10-26 NOTE — H&P (Signed)
[place] [facility] [place] NAME: Holly Novak    MR#:  [id number]  DATE OF BIRTH:  07-15-1935  DATE OF ADMISSION:  10/16/2018  PRIMARY CARE PHYSICIAN: System, Pcp Not In   REQUESTING/REFERRING PHYSICIAN: Lenise Arena, MD  CHIEF COMPLAINT:   Chief Complaint  Patient presents with   Emesis    HISTORY OF PRESENT ILLNESS:  Holly Novak  is a 83 y.o. female with a known history of late onset Alzheimer's disease, bipolar disorder, GERD, myelodysplastic syndrome, and hypertension.  She presented to the emergency room from assisted living facility with a reported 3-day history of persistent nausea and vomiting with decreased oral intake.  Patient appears very weak however she is oriented x3 at the time I am seeing her.  She reports a 3 to 4-day history of nausea and vomiting as well as fever.  She denies abdominal pain however she is experiencing abdominal tenderness.  She denies diarrhea.  She denies hematemesis, hematochezia, or melena.  She denies chest pain or shortness of breath.  She denies dysuria.  She reports her last bowel movement was about 3 days ago.  On arrival, sodium is 125 with BUN 67 and creatinine 3.33.  Hemoglobin is 8.4 and hematocrit 23.6.  Urinalysis is pending.  Abdominal series shows a large volume of stool in the right colon suggestive of constipation..  She has been admitted to the hospital service for further management.  PAST MEDICAL HISTORY:   Past Medical History:  Diagnosis Date   Arthritis    Bipolar 1 disorder ([facility])    GERD (gastroesophageal reflux disease)    High grade myelodysplastic syndrome ([facility])    Hypertension    Late onset Alzheimer disease ([place])    Pancytopenia ([facility])    Restless leg syndrome     PAST SURGICAL HISTORY:  History reviewed. No pertinent surgical history.  SOCIAL HISTORY:   Social History   Tobacco Use   Smoking status: Former Smoker   Smokeless tobacco: Never  Used  Substance Use Topics   Alcohol use: Not Currently    FAMILY HISTORY:   Family History  Family history unknown: Yes    DRUG ALLERGIES:  No Known Allergies  REVIEW OF SYSTEMS:   Review of Systems  Constitutional: Positive for malaise/fatigue. Negative for chills and fever.  HENT: Negative for congestion and sore throat.   Eyes: Negative for blurred vision and double vision.  Respiratory: Negative for shortness of breath.   Cardiovascular: Negative for chest pain and leg swelling.  Gastrointestinal: Positive for abdominal pain, constipation, nausea and vomiting. Negative for diarrhea.  Genitourinary: Negative for dysuria, flank pain and hematuria.  Musculoskeletal: Negative for falls and myalgias.  Skin: Negative for itching and rash.  Neurological: Positive for weakness (general).     MEDICATIONS AT HOME:   Prior to Admission medications   Medication Sig Start Date End Date Taking? Authorizing Provider  acetaminophen (TYLENOL) 325 MG tablet Take 650 mg by mouth every 6 (six) hours as needed for mild pain or moderate pain.   Yes [provider]  acetaminophen (TYLENOL) 325 MG tablet Take 650 mg by mouth every 4 (four) hours as needed for fever.   Yes [provider]  acetaminophen (TYLENOL) 500 MG tablet Take 500 mg by mouth every 8 (eight) hours.    Yes [provider]  alum & mag hydroxide-simeth (MAALOX PLUS) 400-400-40 MG/5ML suspension Take 30 mLs by mouth as needed for indigestion.   Yes [provider]  ammonium lactate (LAC-HYDRIN) 12 % lotion Apply 1 application topically daily.    Yes [provider]  bismuth subsalicylate (PEPTO BISMOL) 262 MG/15ML suspension Take 15 mLs by mouth every 2 (two) hours as needed for indigestion (max 6 doses per 24 hours).    Yes [provider]  calcium carbonate (OSCAL) 1500 (600 Ca) MG TABS tablet Take 600 mg of elemental calcium by mouth 2 (two) times daily with a meal.   Yes  [provider]  Cholecalciferol (VITAMIN D3) 50 MCG (2000 UT) TABS Take 1 tablet by mouth daily.   Yes [provider]  Cyanocobalamin (VITAMIN B-12) 2500 MCG SUBL Place 1 tablet under the tongue daily.   Yes [provider]  docusate sodium (COLACE) 100 MG capsule Take 100 mg by mouth 2 (two) times daily.   Yes [provider]  ferrous sulfate 325 (65 FE) MG tablet Take 325 mg by mouth 2 (two) times a day.   Yes [provider]  guaiFENesin (ROBITUSSIN) 100 MG/5ML liquid Take 10 mLs by mouth every 6 (six) hours as needed for cough.    Yes [provider]  loperamide (IMODIUM A-D) 2 MG tablet Take 2 mg by mouth 4 (four) times daily as needed for diarrhea or loose stools (max 4 doses per 12 hours).    Yes [provider]  LORazepam (ATIVAN) 0.5 MG tablet Take 0.5 mg by mouth 2 (two) times daily.    Yes [provider]  LORazepam (ATIVAN) 0.5 MG tablet Take 0.5 mg by mouth at bedtime as needed for sleep.    Yes [provider]  magnesium hydroxide (MILK OF MAGNESIA) 400 MG/5ML suspension Take 30 mLs by mouth daily as needed for mild constipation.   Yes [provider]  Magnesium Oxide 250 MG TABS Take 250 mg by mouth daily.   Yes [provider]  Melatonin 3 MG TABS Take 3 mg by mouth at bedtime.   Yes [provider]  metoprolol tartrate (LOPRESSOR) 50 MG tablet Take 50 mg by mouth 2 (two) times daily.   Yes [provider]  mirtazapine (REMERON) 30 MG tablet Take 30 mg by mouth at bedtime. 09/29/18  Yes [provider]  OLANZapine (ZYPREXA) 10 MG tablet Take 10 mg by mouth at bedtime.    Yes [provider]  OLANZapine (ZYPREXA) 20 MG tablet Take 20 mg by mouth at bedtime.    Yes [provider]  omeprazole (PRILOSEC) 40 MG capsule Take 40 mg by mouth daily before breakfast.    Yes [provider]  rOPINIRole (REQUIP) 0.5 MG tablet Take 0.5 mg by  mouth at bedtime.   Yes [provider]  Skin Protectants, Misc. (EUCERIN) cream Apply 1 application topically daily.    Yes [provider]  sodium phosphate Pediatric (FLEET) 3.5-9.5 GM/59ML enema Place 1 enema rectally once as needed for severe constipation (not relieved by milk of mag and prune juice).   Yes [provider]      VITAL SIGNS:  Blood pressure (!) 141/56, pulse (!) 101, temperature 99 F (37.2 C), temperature source Oral, resp. rate 16, height 5\' 5"  (1.651 m), weight 72.6 kg, SpO2 98 %.  PHYSICAL EXAMINATION:  Physical Exam Vitals signs and nursing note reviewed.  Constitutional:      General: She is not in acute distress.    Appearance: She is ill-appearing.  HENT:     Head: Normocephalic.     Right  Ear: External ear normal.     Left Ear: External ear normal.     Nose: Nose normal.     Mouth/Throat:     Mouth: Mucous membranes are moist.     Pharynx: Oropharynx is clear.  Eyes:     General: No scleral icterus.    Conjunctiva/sclera: Conjunctivae normal.     Pupils: Pupils are equal, round, and reactive to light.  Neck:     Musculoskeletal: Normal range of motion and neck supple. No muscular tenderness.  Cardiovascular:     Rate and Rhythm: Normal rate and regular rhythm.     Pulses: Normal pulses.     Heart sounds: Normal heart sounds. No murmur. No friction rub. No gallop.   Pulmonary:     Effort: Pulmonary effort is normal. No respiratory distress.     Breath sounds: Normal breath sounds. No wheezing, rhonchi or rales.  Abdominal:     General: Abdomen is flat. Bowel sounds are normal. There is no distension.     Palpations: Abdomen is soft. There is no mass.     Tenderness: There is abdominal tenderness (generalized).  Musculoskeletal:        General: No swelling.     Right lower leg: No edema.     Left lower leg: No edema.  Skin:    General: Skin is warm and dry.     Coloration: Skin is not jaundiced.     Findings: No  rash.  Neurological:     Mental Status: She is alert.     Motor: Weakness present.     Comments: Oriented x2       LABORATORY PANEL:   CBC Recent Labs  Lab 10/31/2018 1638  WBC 8.6  HGB 8.4*  HCT 23.6*  PLT 159   ------------------------------------------------------------------------------------------------------------------  Chemistries  Recent Labs  Lab 10/13/2018 1638  NA 125*  K 4.3  CL 80*  CO2 27  GLUCOSE 176*  BUN 67*  CREATININE 3.33*  CALCIUM 9.2  AST 17  ALT 9  ALKPHOS 50  BILITOT 1.1   ------------------------------------------------------------------------------------------------------------------  Cardiac Enzymes No results for input(s): TROPONINI in the last 168 hours. ------------------------------------------------------------------------------------------------------------------  RADIOLOGY:  Acute Abdominal Series  Result Date: 10/29/2018 CLINICAL DATA:  Initial evaluation for acute nausea and vomiting for past 3 days. EXAM: DG ABDOMEN ACUTE W/ 1V CHEST COMPARISON:  None available. FINDINGS: Transverse heart size at the upper limits of normal. Mediastinal silhouette within normal limits. Aortic atherosclerosis. Lungs are hypoinflated. Associated mild bibasilar subsegmental atelectasis. No focal infiltrates. No pulmonary edema or pleural effusion. No pneumothorax. Multiple scattered mildly prominent gas-filled loops of small bowel seen within the mid and left abdomen, nonspecific, but could reflect sequelae of an underlying enteritis. No overt evidence for obstruction. Large volume retained stool within the right colon, suggesting constipation. No visible free air. Punctate 3 mm calcifications overlying the left renal shadow, suggesting nephrolithiasis. No acute osseous abnormality. Prior fusion at L4-5 noted. IMPRESSION: 1. Scattered mildly prominent gas-filled loops of small bowel within the mid and left abdomen, nonspecific, but could reflect sequelae  of an underlying enteritis. No overt evidence for obstruction. 2. Large volume retained stool within the right colon, suggesting constipation. 3. Possible punctate left renal nephrolithiasis. 4. Shallow lung inflation with mild bibasilar atelectasis. No other active cardiopulmonary disease. Electronically Signed   By: Jeannine Boga M.D.   On: 10/14/2018 20:35      IMPRESSION AND PLAN:   1. dehydration - Continue IV fluids at 75 cc/h -  Recheck CBC and BMP in the a.m. - Telemetry monitoring  2.  Acute renal failure-BUN 67 and creatinine 3.33 - Urinalysis is pending-we will treat based on results when available -Likely secondary to dehydration - May need renal ultrasound if no improvement with hydration  3.  Chronic anemia - Continue to monitor hemoglobin hematocrit closely -No evidence of current blood loss  4.  Hyponatremia - We will get cortisol level in the a.m. - Normal saline at 75 cc/h -Repeat BMP in the a.m.  5.  Generalized weakness - Likely secondary to chronic debilitating disease processes -We will consult physical therapy for supportive care  DVT and PPI prophylaxis have been initiated  All the records are reviewed and case discussed with ED provider. The plan of care was discussed in details with the patient (and family). I answered all questions. The patient agreed to proceed with the above mentioned plan. Further management will depend upon hospital course.   CODE STATUS: Full code  TOTAL TIME TAKING CARE OF THIS PATIENT: 59minutes.    Ada on 10/23/2018 at 9:12 PM  Pager - [phone]  After 6pm go to [email or web address] - Proofreader  [facility]  Office  [phone]  CC: Primary care physician; System, Pcp Not In   Note: This dictation was prepared with Dragon dictation along with smaller phrase technology. Any transcriptional errors that result from this process are unintentional.

## 2018-10-27 ENCOUNTER — Inpatient Hospital Stay: Payer: Medicare Other

## 2018-10-27 LAB — CBC
HCT: 21.7 % — ABNORMAL LOW (ref 36.0–46.0)
Hemoglobin: 7.6 g/dL — ABNORMAL LOW (ref 12.0–15.0)
MCH: 37.8 pg — ABNORMAL HIGH (ref 26.0–34.0)
MCHC: 35 g/dL (ref 30.0–36.0)
MCV: 108 fL — ABNORMAL HIGH (ref 80.0–100.0)
Platelets: 135 10*3/uL — ABNORMAL LOW (ref 150–400)
RBC: 2.01 MIL/uL — ABNORMAL LOW (ref 3.87–5.11)
RDW: 15.8 % — ABNORMAL HIGH (ref 11.5–15.5)
WBC: 7.5 10*3/uL (ref 4.0–10.5)
nRBC: 0 % (ref 0.0–0.2)

## 2018-10-27 LAB — BASIC METABOLIC PANEL
Anion gap: 18 — ABNORMAL HIGH (ref 5–15)
BUN: 78 mg/dL — ABNORMAL HIGH (ref 8–23)
CO2: 27 mmol/L (ref 22–32)
Calcium: 9.1 mg/dL (ref 8.9–10.3)
Chloride: 80 mmol/L — ABNORMAL LOW (ref 98–111)
Creatinine, Ser: 3.97 mg/dL — ABNORMAL HIGH (ref 0.44–1.00)
GFR calc Af Amer: 12 mL/min — ABNORMAL LOW (ref >60)
GFR calc non Af Amer: 10 mL/min — ABNORMAL LOW (ref >60)
Glucose, Bld: 147 mg/dL — ABNORMAL HIGH (ref 70–99)
Potassium: 4 mmol/L (ref 3.5–5.1)
Sodium: 125 mmol/L — ABNORMAL LOW (ref 135–145)

## 2018-10-27 LAB — CORTISOL-AM, BLOOD: Cortisol - AM: 91.7 ug/dL — ABNORMAL HIGH (ref 6.7–22.6)

## 2018-10-27 LAB — MRSA PCR SCREENING: MRSA by PCR: NEGATIVE

## 2018-10-27 MED ORDER — PANTOPRAZOLE SODIUM 40 MG IV SOLR
40.0000 mg | Freq: Two times a day (BID) | INTRAVENOUS | Status: DC
  Administered 2018-10-27 – 2018-10-31 (×9): 40 mg via INTRAVENOUS
  Filled 2018-10-27 (×9): qty 40

## 2018-10-27 MED ORDER — BISACODYL 10 MG RE SUPP
10.0000 mg | Freq: Every day | RECTAL | Status: DC
  Administered 2018-10-27 – 2018-10-31 (×5): 10 mg via RECTAL
  Filled 2018-10-27 (×5): qty 1

## 2018-10-27 MED ORDER — BOOST / RESOURCE BREEZE PO LIQD CUSTOM: 1.0000 | Freq: Two times a day (BID) | ORAL | Status: DC

## 2018-10-27 MED ORDER — ENSURE ENLIVE PO LIQD: 237.0000 mL | ORAL | Status: DC

## 2018-10-27 MED ORDER — HEPARIN SODIUM (PORCINE) 5000 UNIT/ML IJ SOLN
5000.0000 [IU] | Freq: Three times a day (TID) | INTRAMUSCULAR | Status: DC
  Administered 2018-10-27 (×2): 5000 [IU] via SUBCUTANEOUS
  Filled 2018-10-27 (×3): qty 1

## 2018-10-27 MED ORDER — LACTULOSE 10 GM/15ML PO SOLN
20.0000 g | Freq: Three times a day (TID) | ORAL | Status: DC
  Administered 2018-10-27: 10:00:00 20 g via ORAL
  Filled 2018-10-27: qty 30

## 2018-10-27 NOTE — Progress Notes (Signed)
Family Meeting Note  Advance Directive yes  Today's meeting was held with patient's daughter Ms. Ashok Norris on the phone patient is a 83 year old Caucasian female with advance progressively worsening dementia, myelodysplastic syndrome comes in with nausea vomiting severe dehydration/acute renal failure from assisted living. Patient overall has been declining per patient's daughter. Discuss code status. Patient has a DNR form that was signed couple years ago through her primary care physician. Given patient's overall decline patient's daughter request DNR.  Poor prognosis overall. Palliative care if needed  Time spent during discussion: 16 minutes Fritzi Mandes, MD

## 2018-10-27 NOTE — Evaluation (Signed)
Physical Therapy Evaluation Patient Details Name: Holly Novak MRN: [id number] DOB: 1935-07-01 Today's Date: 10/27/2018   History of Present Illness  Pt admitted for acute on chronic renal failure with complaints of N/V x 3 days. History includes Alzheimer's dx, bipolar, GERD, and HTN. Multiple falls per patient report.  Clinical Impression  Pt is a pleasant 83 year old female who was admitted for acute on chronic renal failure. Pt performs bed mobility/transfers with mod assist +2 and ambulation with min assist +2 and RW. Pt demonstrates deficits with strength/mobility/cognition. Needs heavy cues to initiate mobility and is poor historian. Would benefit from skilled PT to address above deficits and promote optimal return to PLOF; recommend transition to STR upon discharge from acute hospitalization.       Follow Up Recommendations SNF    Equipment Recommendations  Rolling walker with 5" wheels    Recommendations for Other Services       Precautions / Restrictions Precautions Precautions: Fall Restrictions Weight Bearing Restrictions: No      Mobility  Bed Mobility Overal bed mobility: Needs Assistance Bed Mobility: Supine to Sit     Supine to sit: Mod assist;+2 for physical assistance     General bed mobility comments: safe technique performed, however needs assist for initiating movement. Once seated at EOB, post leaning noted with +2 assist to maintain position. Improved to min assist.  Transfers Overall transfer level: Needs assistance Equipment used: Rolling walker (2 wheeled) Transfers: Sit to/from Stand Sit to Stand: Mod assist;+2 physical assistance         General transfer comment: forward flexed posture noted and post lean  Ambulation/Gait Ambulation/Gait assistance: Min assist;+2 physical assistance Gait Distance (Feet): 3 Feet Assistive device: Rolling walker (2 wheeled) Gait Pattern/deviations: Step-to pattern     General Gait Details:  ambulated to recliner, reports heavy fatigue. Needs cues for sequencing  Stairs            Wheelchair Mobility    Modified Rankin (Stroke Patients Only)       Balance Overall balance assessment: Needs assistance Sitting-balance support: Feet supported Sitting balance-Leahy Scale: Fair     Standing balance support: Bilateral upper extremity supported Standing balance-Leahy Scale: Fair                               Pertinent Vitals/Pain Pain Assessment: No/denies pain    Home Living Family/patient expects to be discharged to:: Assisted living                 Additional Comments: Pt is poor historian, reports she doesn't live at ALF and lives home with husband. Unsure of prior home equipment.    Prior Function           Comments: unsure of PLOF     Hand Dominance        Extremity/Trunk Assessment   Upper Extremity Assessment Upper Extremity Assessment: Generalized weakness(B UE grossly 3/5)    Lower Extremity Assessment Lower Extremity Assessment: Generalized weakness(B LE grossly 3+/5)       Communication   Communication: No difficulties  Cognition Arousal/Alertness: Awake/alert Behavior During Therapy: WFL for tasks assessed/performed Overall Cognitive Status: Within Functional Limits for tasks assessed                                        General Comments  Exercises Other Exercises Other Exercises: supine ther-ex performed x 10 reps on B LE including ankle pumps, quad sets, SLRs, and B UE shoulder flexion. Min assist for initiation.   Assessment/Plan    PT Assessment Patient needs continued PT services  PT Problem List Decreased strength;Decreased balance;Decreased activity tolerance;Decreased mobility;Decreased safety awareness       PT Treatment Interventions Gait training;Therapeutic exercise;DME instruction    PT Goals (Current goals can be found in the Care Plan section)  Acute Rehab PT  Goals Patient Stated Goal: to go home PT Goal Formulation: With patient Time For Goal Achievement: 11/10/18 Potential to Achieve Goals: Good    Frequency Min 2X/week   Barriers to discharge        Co-evaluation               AM-PAC PT "6 Clicks" Mobility  Outcome Measure Help needed turning from your back to your side while in a flat bed without using bedrails?: A Lot Help needed moving from lying on your back to sitting on the side of a flat bed without using bedrails?: A Lot Help needed moving to and from a bed to a chair (including a wheelchair)?: A Lot Help needed standing up from a chair using your arms (e.g., wheelchair or bedside chair)?: A Lot Help needed to walk in hospital room?: A Lot Help needed climbing 3-5 steps with a railing? : A Lot 6 Click Score: 12    End of Session Equipment Utilized During Treatment: Gait belt Activity Tolerance: Patient limited by fatigue Patient left: in chair;with chair alarm set Nurse Communication: Mobility status PT Visit Diagnosis: Unsteadiness on feet (R26.81);Muscle weakness (generalized) (M62.81);Difficulty in walking, not elsewhere classified (R26.2)    Time: 3151-7616 PT Time Calculation (min) (ACUTE ONLY): 25 min   Charges:   PT Evaluation $PT Eval Moderate Complexity: 1 Mod PT Treatments $Therapeutic Exercise: 8-22 mins        Greggory Stallion, PT, DPT [phone]   [name] 10/27/2018, 3:08 PM

## 2018-10-27 NOTE — Progress Notes (Signed)
Initial Nutrition Assessment  RD working remotely.   DOCUMENTATION CODES:   (unable to assess for malnutrition at this time. )  INTERVENTION:  - will order Boost Breeze BID, each supplement provides 250 kcal and 9 grams of protein. - will order Ensure Enlive once/day, each supplement provides 350 kcal and 20 grams of protein. - continue to encourage PO intakes as tolerated.    NUTRITION DIAGNOSIS:   Inadequate oral intake related to acute illness, nausea, vomiting as evidenced by per patient/family report, meal completion < 50%.  GOAL:   Patient will meet greater than or equal to 90% of their needs  MONITOR:   PO intake, Supplement acceptance, Labs, Weight trends, I & O's  REASON FOR ASSESSMENT:   Malnutrition Screening Tool  ASSESSMENT:   83 y.o. female with a known history of late onset Alzheimer's disease, bipolar disorder, GERD, myelodysplastic syndrome, and HTN. She presented to the ED from ALF with a reported 3-day history of persistent N/V with associated decreased oral intake; she denied abdominal pain but was experiencing abdominal tenderness. Patient appeared very weak in the ED. In the ED, serum Na was 125 mmol/l. Abdominal series showed a large volume of stool in the R colon suggestive of constipation.  Per RN flow sheet, patient is a/o to self only; did not attempt to call patient d/t this. Per flow sheet, she consumed 20% of breakfast this AM. Flow sheet indicates episode of emesis (black) at 11 AM today.   Per chart review, current weight is 134 lb and weight on 05/22/18 was 140 lb. This indicates 6 lb weight loss (4% body weight) in the past 5 months; not significant for time frame.   Per notes: abdominal x-ray showed constipation, patient is a very poor historian d/t dementia, dehydration on admission with IV fluids ordered, chronic anemia, ARF with possible need for renal ultrasound if no improvement with IV fluids, hyponatremia, generalized  weakness.  Medications reviewed; 100 mg colace BID, 325 mg ferrous sulfate BID, 1 mg folvite/day, 20 g lactulose TID, 200 mg mag-ox/day, 2.5 mg melatonin/night, 30 mg remeron/day, daily multivitamin with minerals, 100 mg thiamine/day.  Labs reviewed; Na: 125 mmol/l, Cl: 80 mmol/l, BUN: 78 mg/dl, creatinine: 3.97 mg/dl, GFR: 10 ml/min.  IVF; NS @ 100 ml/hr.     NUTRITION - FOCUSED PHYSICAL EXAM:  unable to perform at this time.   Diet Order:   Diet Order            Diet Heart Room service appropriate? Yes; Fluid consistency: Thin  Diet effective now              EDUCATION NEEDS:   No education needs have been identified at this time  Skin:  Skin Assessment: Reviewed RN Assessment  Last BM:  5/25  Height:   Ht Readings from Last 1 Encounters:  10/25/2018 5\' 6"  (1.676 m)    Weight:   Wt Readings from Last 1 Encounters:  10/29/2018 60.9 kg    Ideal Body Weight:  59.1 kg  BMI:  Body mass index is 21.66 kg/m.  Estimated Nutritional Needs:   Kcal:  1525-1705 kcal  Protein:  65-75 grams  Fluid:  >/= 1.8 L/day     Jarome Matin, MS, RD, LDN, [facility] Inpatient Clinical Dietitian Pager # [phone] After hours/weekend pager # [phone]

## 2018-10-27 NOTE — Consult Note (Signed)
[facility]  CONSULT NOTE    Date: 10/27/2018                  Patient Name:  Holly Novak  MRN: [id number]  DOB: 06/07/1935  Age / Sex: 83 y.o., female         PCP: System, Pcp Not In                 Service Requesting Consult: Dr. Posey Pronto                 Reason for Consult: Acute renal failure            History of Present Illness: Ms. Holly Novak is a 83 y.o. white female with dementia, pancytopenia, hypertension, myelodysplastic syndrome, Bipolar disorder, who was admitted to [facility] on 10/21/2018 for Hyponatremia [E87.1] Abdominal pain [R10.9] Non-intractable vomiting with nausea, unspecified vomiting type [R11.2] Acute renal failure superimposed on chronic kidney disease, unspecified CKD stage, unspecified acute renal failure type ([place]) [N17.9, N18.9]  Patient unable to give history. Patient started on IV fluids.   Patient is able to eat but continues to vomit everything consumed.    Medications: Outpatient medications: Medications Prior to Admission  Medication Sig Dispense Refill Last Dose  . acetaminophen (TYLENOL) 325 MG tablet Take 650 mg by mouth every 6 (six) hours as needed for mild pain or moderate pain.   Unknown at PRN  . acetaminophen (TYLENOL) 325 MG tablet Take 650 mg by mouth every 4 (four) hours as needed for fever.   Unknown at PRN  . acetaminophen (TYLENOL) 500 MG tablet Take 500 mg by mouth every 8 (eight) hours.    10/02/2018 at 1400  . alum & mag hydroxide-simeth (MAALOX PLUS) 400-400-40 MG/5ML suspension Take 30 mLs by mouth as needed for indigestion.   Unknown at PRN  . ammonium lactate (LAC-HYDRIN) 12 % lotion Apply 1 application topically daily.    10/28/2018 at 0800  . bismuth subsalicylate (PEPTO BISMOL) 262 MG/15ML suspension Take 15 mLs by mouth every 2 (two) hours as needed for indigestion (max 6 doses per 24 hours).    Unknown at PRN  . calcium carbonate (OSCAL) 1500 (600 Ca) MG TABS tablet Take 600 mg of elemental  calcium by mouth 2 (two) times daily with a meal.   10/04/2018 at 0800  . Cholecalciferol (VITAMIN D3) 50 MCG (2000 UT) TABS Take 1 tablet by mouth daily.   11/01/2018 at 0800  . Cyanocobalamin (VITAMIN B-12) 2500 MCG SUBL Place 1 tablet under the tongue daily.   10/14/2018 at 0800  . docusate sodium (COLACE) 100 MG capsule Take 100 mg by mouth 2 (two) times daily.   10/10/2018 at 0800  . ferrous sulfate 325 (65 FE) MG tablet Take 325 mg by mouth 2 (two) times a day.   11/01/2018 at 0800  . guaiFENesin (ROBITUSSIN) 100 MG/5ML liquid Take 10 mLs by mouth every 6 (six) hours as needed for cough.    Unknown at PRN  . loperamide (IMODIUM A-D) 2 MG tablet Take 2 mg by mouth 4 (four) times daily as needed for diarrhea or loose stools (max 4 doses per 12 hours).    Unknown at PRN  . LORazepam (ATIVAN) 0.5 MG tablet Take 0.5 mg by mouth 2 (two) times daily.    10/17/2018 at 0800  . LORazepam (ATIVAN) 0.5 MG tablet Take 0.5 mg by mouth at bedtime as needed for sleep.    Unknown at PRN  .  magnesium hydroxide (MILK OF MAGNESIA) 400 MG/5ML suspension Take 30 mLs by mouth daily as needed for mild constipation.   Unknown at PRN  . Magnesium Oxide 250 MG TABS Take 250 mg by mouth daily.   10/10/2018 at 0800  . Melatonin 3 MG TABS Take 3 mg by mouth at bedtime.   10/25/2018 at 2000  . metoprolol tartrate (LOPRESSOR) 50 MG tablet Take 50 mg by mouth 2 (two) times daily.   10/20/2018 at 0800  . mirtazapine (REMERON) 30 MG tablet Take 30 mg by mouth at bedtime.   10/25/2018 at 2000  . OLANZapine (ZYPREXA) 10 MG tablet Take 10 mg by mouth at bedtime.    10/25/2018 at 2000  . OLANZapine (ZYPREXA) 20 MG tablet Take 20 mg by mouth at bedtime.    10/25/2018 at 2000  . omeprazole (PRILOSEC) 40 MG capsule Take 40 mg by mouth daily before breakfast.    10/13/2018 at 0630  . rOPINIRole (REQUIP) 0.5 MG tablet Take 0.5 mg by mouth at bedtime.   10/25/2018 at 2000  . Skin Protectants, Misc. (EUCERIN) cream Apply 1 application topically daily.     10/11/2018 at Unknown time  . sodium phosphate Pediatric (FLEET) 3.5-9.5 GM/59ML enema Place 1 enema rectally once as needed for severe constipation (not relieved by milk of mag and prune juice).   Unknown at PRN    Current medications: Current Facility-Administered Medications  Medication Dose Route Frequency Provider Last Rate Last Dose  . 0.9 %  sodium chloride infusion   Intravenous Continuous Fritzi Mandes, MD 100 mL/hr at 10/27/18 0921    . acetaminophen (TYLENOL) tablet 650 mg  650 mg Oral Q6H PRN Seals, Theo Dills, NP       Or  . acetaminophen (TYLENOL) suppository 650 mg  650 mg Rectal Q6H PRN Seals, Theo Dills, NP      . calcium carbonate (TUMS - dosed in mg elemental calcium) chewable tablet 600 mg of elemental calcium  600 mg of elemental calcium Oral BID WC Seals, Angela H, NP   600 mg of elemental calcium at 10/27/18 0939  . docusate sodium (COLACE) capsule 100 mg  100 mg Oral BID Gardiner Barefoot H, NP   100 mg at 10/27/18 0936  . feeding supplement (BOOST / RESOURCE BREEZE) liquid 1 Container  1 Container Oral BID BM Fritzi Mandes, MD      . feeding supplement (ENSURE ENLIVE) (ENSURE ENLIVE) liquid 237 mL  237 mL Oral Q24H Fritzi Mandes, MD      . ferrous sulfate tablet 325 mg  325 mg Oral BID Gardiner Barefoot H, NP   325 mg at 10/27/18 1054  . folic acid (FOLVITE) tablet 1 mg  1 mg Oral Daily Seals, Theo Dills, NP   1 mg at 10/27/18 0936  . lactulose (CHRONULAC) 10 GM/15ML solution 20 g  20 g Oral TID Fritzi Mandes, MD   20 g at 10/27/18 0936  . LORazepam (ATIVAN) tablet 0.5 mg  0.5 mg Oral QHS PRN Seals, Angela H, NP      . magnesium oxide (MAG-OX) tablet 200 mg  200 mg Oral Daily Seals, Angela H, NP   200 mg at 10/27/18 1054  . Melatonin TABS 2.5 mg  2.5 mg Oral QHS Seals, Angela H, NP   2.5 mg at 10/18/2018 2220  . metoprolol tartrate (LOPRESSOR) tablet 50 mg  50 mg Oral BID Gardiner Barefoot H, NP   50 mg at 10/27/18 1478  . mirtazapine (REMERON) tablet 30 mg  30 mg Oral QHS Seals, Theo Dills, NP    30 mg at 10/12/2018 2206  . multivitamin with minerals tablet 1 tablet  1 tablet Oral Daily Seals, Theo Dills, NP   1 tablet at 10/27/18 1054  . OLANZapine (ZYPREXA) tablet 10 mg  10 mg Oral QHS Seals, Angela H, NP   10 mg at 10/25/2018 2202  . OLANZapine (ZYPREXA) tablet 20 mg  20 mg Oral QHS Seals, Angela H, NP   20 mg at 10/10/2018 2220  . ondansetron (ZOFRAN) tablet 4 mg  4 mg Oral Q6H PRN Seals, Theo Dills, NP       Or  . ondansetron (ZOFRAN) injection 4 mg  4 mg Intravenous Q6H PRN Seals, Theo Dills, NP   4 mg at 10/27/18 1122  . pantoprazole (PROTONIX) EC tablet 40 mg  40 mg Oral Daily Seals, Theo Dills, NP   40 mg at 10/27/18 0936  . polyethylene glycol (MIRALAX / GLYCOLAX) packet 17 g  17 g Oral Daily PRN Seals, Levada Dy H, NP      . rOPINIRole (REQUIP) tablet 0.5 mg  0.5 mg Oral QHS Seals, Angela H, NP   0.5 mg at 10/22/2018 2222  . thiamine (VITAMIN B-1) tablet 100 mg  100 mg Oral Daily Seals, Angela H, NP   100 mg at 10/27/18 1054      Allergies: No Known Allergies    Past Medical History: Past Medical History:  Diagnosis Date  . Arthritis   . Bipolar 1 disorder ([place])   . GERD (gastroesophageal reflux disease)   . High grade myelodysplastic syndrome ([place])   . Hypertension   . Late onset Alzheimer disease ([place])   . Pancytopenia ([place])   . Restless leg syndrome      Past Surgical History: History reviewed. No pertinent surgical history.   Family History: Family History  Family history unknown: Yes     Social History: Social History   Socioeconomic History  . Marital status: Single    Spouse name: Not on file  . Number of children: Not on file  . Years of education: Not on file  . Highest education level: Not on file  Occupational History  . Not on file  Social Needs  . Financial resource strain: Not very hard  . Food insecurity:    Worry: Patient refused    Inability: Patient refused  . Transportation needs:    Medical: Patient refused    Non-medical: Patient  refused  Tobacco Use  . Smoking status: Former Research scientist (life sciences)  . Smokeless tobacco: Never Used  Substance and Sexual Activity  . Alcohol use: Not Currently  . Drug use: Not Currently  . Sexual activity: Not Currently  Lifestyle  . Physical activity:    Days per week: Patient refused    Minutes per session: Patient refused  . Stress: To some extent  Relationships  . Social connections:    Talks on phone: Patient refused    Gets together: Patient refused    Attends religious service: Patient refused    Active member of club or organization: Patient refused    Attends meetings of clubs or organizations: Patient refused    Relationship status: Patient refused  . Intimate partner violence:    Fear of current or ex partner: Patient refused    Emotionally abused: Patient refused    Physically abused: Patient refused    Forced sexual activity: Patient refused  Other Topics Concern  . Not on file  Social History Narrative  .  Not on file     Review of Systems: Review of Systems  Unable to perform ROS: Dementia    Vital Signs: Blood pressure (!) 134/56, pulse (!) 102, temperature 99.5 F (37.5 C), temperature source Oral, resp. rate 19, height 5\' 6"  (1.676 m), weight 60.9 kg, SpO2 97 %.  Weight trends: Filed Weights   10/25/2018 1632 10/29/2018 2131  Weight: 72.6 kg 60.9 kg    Physical Exam: General: NAD, laying in bed  Head: Normocephalic, atraumatic. Moist oral mucosal membranes  Eyes: Anicteric, PERRL  Neck: Supple, trachea midline  Lungs:  Clear to auscultation  Heart: Regular rate and rhythm  Abdomen:  Soft, nontender,   Extremities: no peripheral edema.  Neurologic: Alert to self only  Skin: No lesions         Lab results: Basic Metabolic Panel: Recent Labs  Lab 10/07/2018 1638 10/27/18 0529  NA 125* 125*  K 4.3 4.0  CL 80* 80*  CO2 27 27  GLUCOSE 176* 147*  BUN 67* 78*  CREATININE 3.33* 3.97*  CALCIUM 9.2 9.1    Liver Function Tests: Recent Labs  Lab  10/09/2018 1638  AST 17  ALT 9  ALKPHOS 50  BILITOT 1.1  PROT 7.8  ALBUMIN 4.6   Recent Labs  Lab 10/25/2018 1638  LIPASE 23   No results for input(s): AMMONIA in the last 168 hours.  CBC: Recent Labs  Lab 10/16/2018 1638 10/27/18 0529  WBC 8.6 7.5  HGB 8.4* 7.6*  HCT 23.6* 21.7*  MCV 106.8* 108.0*  PLT 159 135*    Cardiac Enzymes: No results for input(s): CKTOTAL, CKMB, CKMBINDEX, TROPONINI in the last 168 hours.  BNP: Invalid input(s): POCBNP  CBG: Recent Labs  Lab 10/24/2018 1633  GLUCAP 165*    Microbiology: Results for orders placed or performed during the hospital encounter of 10/06/2018  SARS Coronavirus 2 (CEPHEID - Performed in [place] hospital lab), Hosp Order     Status: None   Collection Time: 10/10/2018  7:17 PM  Result Value Ref Range Status   SARS Coronavirus 2 NEGATIVE NEGATIVE Final    Comment: (NOTE) If result is NEGATIVE SARS-CoV-2 target nucleic acids are NOT DETECTED. The SARS-CoV-2 RNA is generally detectable in upper and lower  respiratory specimens during the acute phase of infection. The lowest  concentration of SARS-CoV-2 viral copies this assay can detect is 250  copies / mL. A negative result does not preclude SARS-CoV-2 infection  and should not be used as the sole basis for treatment or other  patient management decisions.  A negative result may occur with  improper specimen collection / handling, submission of specimen other  than nasopharyngeal swab, presence of viral mutation(s) within the  areas targeted by this assay, and inadequate number of viral copies  (<250 copies / mL). A negative result must be combined with clinical  observations, patient history, and epidemiological information. If result is POSITIVE SARS-CoV-2 target nucleic acids are DETECTED. The SARS-CoV-2 RNA is generally detectable in upper and lower  respiratory specimens dur ing the acute phase of infection.  Positive  results are indicative of active  infection with SARS-CoV-2.  Clinical  correlation with patient history and other diagnostic information is  necessary to determine patient infection status.  Positive results do  not rule out bacterial infection or co-infection with other viruses. If result is PRESUMPTIVE POSTIVE SARS-CoV-2 nucleic acids MAY BE PRESENT.   A presumptive positive result was obtained on the submitted specimen  and confirmed on  repeat testing.  While 2019 novel coronavirus  (SARS-CoV-2) nucleic acids may be present in the submitted sample  additional confirmatory testing may be necessary for epidemiological  and / or clinical management purposes  to differentiate between  SARS-CoV-2 and other Sarbecovirus currently known to infect humans.  If clinically indicated additional testing with an alternate test  methodology [id number]) is advised. The SARS-CoV-2 RNA is generally  detectable in upper and lower respiratory sp ecimens during the acute  phase of infection. The expected result is Negative. Fact Sheet for Patients:  StrictlyIdeas.no Fact Sheet for Healthcare Providers: BankingDealers.co.za This test is not yet approved or cleared by the [place] FDA and has been authorized for detection and/or diagnosis of SARS-CoV-2 by FDA under an Emergency Use Authorization (EUA).  This EUA will remain in effect (meaning this test can be used) for the duration of the COVID-19 declaration under Section 564(b)(1) of the Act, 21 U.S.C. section 360bbb-3(b)(1), unless the authorization is terminated or revoked sooner. Performed at [facility], [address] [place], [place] [address]   MRSA PCR Screening     Status: None   Collection Time: 10/27/18  6:41 AM  Result Value Ref Range Status   MRSA by PCR NEGATIVE NEGATIVE Final    Comment:        The GeneXpert MRSA Assay (FDA approved for NASAL specimens only), is one component of a comprehensive MRSA  colonization surveillance program. It is not intended to diagnose MRSA infection nor to guide or monitor treatment for MRSA infections. Performed at [facility], [address] [place], [place] [address]     Coagulation Studies: No results for input(s): LABPROT, INR in the last 72 hours.  Urinalysis: Recent Labs    10/20/2018 1917  COLORURINE YELLOW*  LABSPEC 1.019  PHURINE 5.0  GLUCOSEU NEGATIVE  HGBUR NEGATIVE  BILIRUBINUR NEGATIVE  KETONESUR NEGATIVE  PROTEINUR NEGATIVE  NITRITE NEGATIVE  LEUKOCYTESUR NEGATIVE      Imaging: Acute Abdominal Series  Result Date: 10/13/2018 CLINICAL DATA:  Initial evaluation for acute nausea and vomiting for past 3 days. EXAM: DG ABDOMEN ACUTE W/ 1V CHEST COMPARISON:  None available. FINDINGS: Transverse heart size at the upper limits of normal. Mediastinal silhouette within normal limits. Aortic atherosclerosis. Lungs are hypoinflated. Associated mild bibasilar subsegmental atelectasis. No focal infiltrates. No pulmonary edema or pleural effusion. No pneumothorax. Multiple scattered mildly prominent gas-filled loops of small bowel seen within the mid and left abdomen, nonspecific, but could reflect sequelae of an underlying enteritis. No overt evidence for obstruction. Large volume retained stool within the right colon, suggesting constipation. No visible free air. Punctate 3 mm calcifications overlying the left renal shadow, suggesting nephrolithiasis. No acute osseous abnormality. Prior fusion at L4-5 noted. IMPRESSION: 1. Scattered mildly prominent gas-filled loops of small bowel within the mid and left abdomen, nonspecific, but could reflect sequelae of an underlying enteritis. No overt evidence for obstruction. 2. Large volume retained stool within the right colon, suggesting constipation. 3. Possible punctate left renal nephrolithiasis. 4. Shallow lung inflation with mild bibasilar atelectasis. No other active cardiopulmonary  disease. Electronically Signed   By: Jeannine Boga M.D.   On: 10/15/2018 20:35      Assessment & Plan: Ms. TRINTY MARKEN is a 84 y.o. white female with dementia, pancytopenia, hypertension, myelodysplastic syndrome, Bipolar disorder, who was admitted to [facility] on 10/04/2018 for Hyponatremia [E87.1] Abdominal pain [R10.9] Non-intractable vomiting with nausea, unspecified vomiting type [R11.2] Acute renal failure superimposed on chronic kidney disease,  unspecified CKD stage, unspecified acute renal failure type ([place]) [N17.9, N18.9]  1. Acute renal failure: on chronic kidney disease stage III: baseline creatinine of 1.26, GFR of 40 on 05/22/18 2. Hyponatremia 3. Anemia  Impression Prerenal azotemia secondary to GI losses and unable to keep food down.  - NGT to suction - Continue IV fluids.  - Not a candidate for dialysis   LOS: 1 [facility] 5/26/20202:52 PM

## 2018-10-27 NOTE — Progress Notes (Signed)
[place] [facility] [place] NAME: Holly Novak    MR#:  [id number]  DATE OF BIRTH:  [birth year]  SUBJECTIVE:   Came in from assisted living with nausea vomiting poor PO intake found to be significantly dehydrated. Patient barely able to keep her eyes open. REVIEW OF SYSTEMS:   Review of Systems  Unable to perform ROS: Dementia   Tolerating Diet:no Tolerating PT: pending  DRUG ALLERGIES:  No Known Allergies  VITALS:  Blood pressure (!) 134/56, pulse (!) 102, temperature 99.5 F (37.5 C), temperature source Oral, resp. rate 19, height 5\' 6"  (1.676 m), weight 60.9 kg, SpO2 97 %.  PHYSICAL EXAMINATION:   Physical Exam  GENERAL:  83 y.o.-year-old patient lying in the bed with no acute distress. Appears chronically/critically ill EYES: Pupils equal, round, reactive to light and accommodation. No scleral icterus. HEENT: Head atraumatic, noramocephalic. Oropharynx and nasopharynx clear. Oral mucosa dry NECK:  Supple, no jugular venous distention. No thyroid enlargement, no tenderness.  LUNGS: Normal breath sounds bilaterally, no wheezing, rales, rhonchi. No use of accessory muscles of respiration.  CARDIOVASCULAR: S1, S2 normal. No murmurs, rubs, or gallops.  ABDOMEN: Soft, nontender, nondistended. Bowel sounds present. No organomegaly or mass.  EXTREMITIES: No cyanosis, clubbing or edema b/l.    NEUROLOGIC: limited exam. Patient is advanced dementia not cooperative. She is quite lethargic PSYCHIATRIC:  patient is alert means lethargic most of the time. SKIN: per nurse documentation  LABORATORY PANEL:  CBC Recent Labs  Lab 10/27/18 0529  WBC 7.5  HGB 7.6*  HCT 21.7*  PLT 135*    Chemistries  Recent Labs  Lab 10/02/2018 1638 10/27/18 0529  NA 125* 125*  K 4.3 4.0  CL 80* 80*  CO2 27 27  GLUCOSE 176* 147*  BUN 67* 78*  CREATININE 3.33* 3.97*  CALCIUM 9.2 9.1  AST 17  --   ALT 9  --   ALKPHOS 50  --   BILITOT 1.1  --     Cardiac Enzymes No results for input(s): TROPONINI in the last 168 hours. RADIOLOGY:  Acute Abdominal Series  Result Date: 10/12/2018 CLINICAL DATA:  Initial evaluation for acute nausea and vomiting for past 3 days. EXAM: DG ABDOMEN ACUTE W/ 1V CHEST COMPARISON:  None available. FINDINGS: Transverse heart size at the upper limits of normal. Mediastinal silhouette within normal limits. Aortic atherosclerosis. Lungs are hypoinflated. Associated mild bibasilar subsegmental atelectasis. No focal infiltrates. No pulmonary edema or pleural effusion. No pneumothorax. Multiple scattered mildly prominent gas-filled loops of small bowel seen within the mid and left abdomen, nonspecific, but could reflect sequelae of an underlying enteritis. No overt evidence for obstruction. Large volume retained stool within the right colon, suggesting constipation. No visible free air. Punctate 3 mm calcifications overlying the left renal shadow, suggesting nephrolithiasis. No acute osseous abnormality. Prior fusion at L4-5 noted. IMPRESSION: 1. Scattered mildly prominent gas-filled loops of small bowel within the mid and left abdomen, nonspecific, but could reflect sequelae of an underlying enteritis. No overt evidence for obstruction. 2. Large volume retained stool within the right colon, suggesting constipation. 3. Possible punctate left renal nephrolithiasis. 4. Shallow lung inflation with mild bibasilar atelectasis. No other active cardiopulmonary disease. Electronically Signed   By: Jeannine Boga M.D.   On: 10/20/2018 20:35   ASSESSMENT AND PLAN:  Holly Novak  is a 83 y.o. female with a known history of late onset Alzheimer's disease, bipolar disorder, GERD, myelodysplastic syndrome, and hypertension.  She presented  to the emergency room from assisted living facility with a reported 3-day history of persistent nausea and vomiting with decreased oral intake.  1.  Acute renal failure-BUN 67 and creatinine 3.33  (creat 1.26 in dec 2019) - Urinalysis is pending-we will treat based on results when available -Likely secondary to dehydration/poor po intake/constipation - May need renal ultrasound if no improvement with hydration -baseline creat 1.2 -nephrology consultation with Dr. Juleen China -patient has had some nausea and vomiting. Will place NG tube if needed. She is at a high risk for aspiration  3.  Chronic anemia-- patient has history of high grade myelodysplastic syndrome-- daughter Ashok Norris she used to follow-up with [facility] -I am not able see any care everywhere records. Patient's daughter does not have any other details. - Continue to monitor hemoglobin hematocrit closely -No evidence of current blood loss-- transfuse as needed   4.  Hyponatremia - We will get cortisol level in the a.m. -continue IV fluids -Repeat BMP in the a.m.  5.  Generalized weakness - Likely secondary to chronic debilitating disease processes -We will consult physical therapy for supportive care  6. Constipation PO Dulcolax/enema  7. Advanced dementia -per daughter patient has been progressively declining from a dementia standpoint. -She is at [facility]. -Will consider palliative care consultation if patient does not show any improvement  DVT and PPI prophylaxis have been initiated    CODE STATUS: DNR  DVT Prophylaxis: heparin  TOTAL TIME TAKING CARE OF THIS PATIENT: 35 minutes.  >50% time spent on counselling and coordination of care  POSSIBLE D/C IN few DAYS, DEPENDING ON CLINICAL CONDITION.  Note: This dictation was prepared with Dragon dictation along with smaller phrase technology. Any transcriptional errors that result from this process are unintentional.  Fritzi Mandes M.D on 10/27/2018 at 2:50 PM  Between 7am to 6pm - Pager - [phone]  After 6pm go to [email or web address] - password [email or web address]  [facility]  Office  [phone]  CC: Primary care physician; System,  Pcp Not InPatient ID: Holly Novak, female   DOB: 1935/12/13, 83 y.o.   MRN: [id number]

## 2018-10-27 NOTE — Progress Notes (Signed)
MD made aware of drop in Hgb and sodium unchanged.  No interventions at this time.

## 2018-10-28 ENCOUNTER — Inpatient Hospital Stay: Payer: Medicare Other

## 2018-10-28 DIAGNOSIS — K56609 Unspecified intestinal obstruction, unspecified as to partial versus complete obstruction: Secondary | ICD-10-CM

## 2018-10-28 LAB — CBC
HCT: 16.8 % — ABNORMAL LOW (ref 36.0–46.0)
Hemoglobin: 5.8 g/dL — ABNORMAL LOW (ref 12.0–15.0)
MCH: 37.9 pg — ABNORMAL HIGH (ref 26.0–34.0)
MCHC: 34.5 g/dL (ref 30.0–36.0)
MCV: 109.8 fL — ABNORMAL HIGH (ref 80.0–100.0)
Platelets: 108 10*3/uL — ABNORMAL LOW (ref 150–400)
RBC: 1.53 MIL/uL — ABNORMAL LOW (ref 3.87–5.11)
RDW: 16.4 % — ABNORMAL HIGH (ref 11.5–15.5)
WBC: 4.4 10*3/uL (ref 4.0–10.5)
nRBC: 0 % (ref 0.0–0.2)

## 2018-10-28 LAB — BASIC METABOLIC PANEL
Anion gap: 12 (ref 5–15)
BUN: 81 mg/dL — ABNORMAL HIGH (ref 8–23)
CO2: 27 mmol/L (ref 22–32)
Calcium: 8.1 mg/dL — ABNORMAL LOW (ref 8.9–10.3)
Chloride: 89 mmol/L — ABNORMAL LOW (ref 98–111)
Creatinine, Ser: 3.03 mg/dL — ABNORMAL HIGH (ref 0.44–1.00)
GFR calc Af Amer: 16 mL/min — ABNORMAL LOW (ref >60)
GFR calc non Af Amer: 14 mL/min — ABNORMAL LOW (ref >60)
Glucose, Bld: 121 mg/dL — ABNORMAL HIGH (ref 70–99)
Potassium: 3.4 mmol/L — ABNORMAL LOW (ref 3.5–5.1)
Sodium: 128 mmol/L — ABNORMAL LOW (ref 135–145)

## 2018-10-28 LAB — PREPARE RBC (CROSSMATCH)

## 2018-10-28 LAB — URINE CULTURE
Culture: NO GROWTH
Special Requests: NORMAL

## 2018-10-28 MED ORDER — SODIUM CHLORIDE 0.9 % IV SOLN
3.0000 g | Freq: Two times a day (BID) | INTRAVENOUS | Status: DC
  Administered 2018-10-28 – 2018-11-01 (×8): 3 g via INTRAVENOUS
  Filled 2018-10-28 (×9): qty 3

## 2018-10-28 MED ORDER — METOPROLOL TARTRATE 5 MG/5ML IV SOLN
2.5000 mg | Freq: Four times a day (QID) | INTRAVENOUS | Status: DC
  Administered 2018-10-28 – 2018-10-31 (×14): 2.5 mg via INTRAVENOUS
  Filled 2018-10-28 (×15): qty 5

## 2018-10-28 MED ORDER — POTASSIUM CHLORIDE 10 MEQ/100ML IV SOLN
10.0000 meq | INTRAVENOUS | Status: AC
  Administered 2018-10-29 (×2): 10 meq via INTRAVENOUS
  Filled 2018-10-28 (×2): qty 100

## 2018-10-28 MED ORDER — OLANZAPINE 10 MG IM SOLR
30.0000 mg | Freq: Every day | INTRAMUSCULAR | Status: DC
  Filled 2018-10-28: qty 30

## 2018-10-28 MED ORDER — POTASSIUM CHLORIDE 10 MEQ/100ML IV SOLN: 10.0000 meq | INTRAVENOUS | Status: AC

## 2018-10-28 MED ORDER — POTASSIUM CHLORIDE 10 MEQ/100ML IV SOLN: 10.0000 meq | INTRAVENOUS | Status: DC

## 2018-10-28 MED ORDER — SODIUM CHLORIDE 0.9% IV SOLUTION
Freq: Once | INTRAVENOUS | Status: AC
  Administered 2018-10-28: 13:00:00 via INTRAVENOUS

## 2018-10-28 NOTE — Progress Notes (Signed)
Noticed some blood in the NG tube and in the pt's mouth. Pt NPO. Performed mouth care, pt tolerated well. MD aware and notified.continue to monitor

## 2018-10-28 NOTE — Progress Notes (Addendum)
[facility]  ROUNDING NOTE   Subjective:    Unable to give a history or ROS due to dementia.  She is currently NPO.   Objective:  Vital signs in last 24 hours:  Temp:  [98.5 F (36.9 C)-99.5 F (37.5 C)] 99.5 F (37.5 C) (05/27 0419) Pulse Rate:  [99-105] 105 (05/27 0419) Resp:  [16-20] 16 (05/27 0419) BP: (125-135)/(47-66) 125/53 (05/27 0419) SpO2:  [78 %-99 %] 94 % (05/27 0419)  Weight change:  Filed Weights   10/06/2018 1632 10/11/2018 2131  Weight: 72.6 kg 60.9 kg    Intake/Output: I/O last 3 completed shifts: In: 3502.7 [P.O.:20; I.V.:2482.7; NG/GT:1000] Out: 2830 [Urine:2380; Emesis/NG output:450]   Intake/Output this shift:  No intake/output data recorded.  Physical Exam: General: NAD, lying in bed.  Head: Normocephalic, atraumatic. dry oral mucosal membranes  Eyes: Anicteric, PERRL  Neck: Supple, trachea midline  Lungs:  Clear to auscultation  Heart: Regular rate and rhythm  Abdomen:  Soft, nontender, hypoactive bowel sounds  Extremities:  No peripheral edema.  Neurologic: Alert to self,follows basic instructions   Skin: No lesions  GU +Foley Catheter     Basic Metabolic Panel: Recent Labs  Lab 10/10/2018 1638 10/27/18 0529 10/28/18 0845  NA 125* 125* 128*  K 4.3 4.0 3.4*  CL 80* 80* 89*  CO2 27 27 27   GLUCOSE 176* 147* 121*  BUN 67* 78* 81*  CREATININE 3.33* 3.97* 3.03*  CALCIUM 9.2 9.1 8.1*    Liver Function Tests: Recent Labs  Lab 10/10/2018 1638  AST 17  ALT 9  ALKPHOS 50  BILITOT 1.1  PROT 7.8  ALBUMIN 4.6   Recent Labs  Lab 10/05/2018 1638  LIPASE 23   No results for input(s): AMMONIA in the last 168 hours.  CBC: Recent Labs  Lab 10/17/2018 1638 10/27/18 0529 10/28/18 0845  WBC 8.6 7.5 4.4  HGB 8.4* 7.6* 5.8*  HCT 23.6* 21.7* 16.8*  MCV 106.8* 108.0* 109.8*  PLT 159 135* 108*    Cardiac Enzymes: No results for input(s): CKTOTAL, CKMB, CKMBINDEX, TROPONINI in the last 168 hours.  BNP: Invalid input(s):  POCBNP  CBG: Recent Labs  Lab 10/30/2018 1633  GLUCAP 165*    Microbiology: Results for orders placed or performed during the hospital encounter of 10/10/2018  SARS Coronavirus 2 (CEPHEID - Performed in [place] hospital lab), Hosp Order     Status: None   Collection Time: 10/09/2018  7:17 PM  Result Value Ref Range Status   SARS Coronavirus 2 NEGATIVE NEGATIVE Final    Comment: (NOTE) If result is NEGATIVE SARS-CoV-2 target nucleic acids are NOT DETECTED. The SARS-CoV-2 RNA is generally detectable in upper and lower  respiratory specimens during the acute phase of infection. The lowest  concentration of SARS-CoV-2 viral copies this assay can detect is 250  copies / mL. A negative result does not preclude SARS-CoV-2 infection  and should not be used as the sole basis for treatment or other  patient management decisions.  A negative result may occur with  improper specimen collection / handling, submission of specimen other  than nasopharyngeal swab, presence of viral mutation(s) within the  areas targeted by this assay, and inadequate number of viral copies  (<250 copies / mL). A negative result must be combined with clinical  observations, patient history, and epidemiological information. If result is POSITIVE SARS-CoV-2 target nucleic acids are DETECTED. The SARS-CoV-2 RNA is generally detectable in upper and lower  respiratory specimens dur ing the acute phase  of infection.  Positive  results are indicative of active infection with SARS-CoV-2.  Clinical  correlation with patient history and other diagnostic information is  necessary to determine patient infection status.  Positive results do  not rule out bacterial infection or co-infection with other viruses. If result is PRESUMPTIVE POSTIVE SARS-CoV-2 nucleic acids MAY BE PRESENT.   A presumptive positive result was obtained on the submitted specimen  and confirmed on repeat testing.  While 2019 novel coronavirus   (SARS-CoV-2) nucleic acids may be present in the submitted sample  additional confirmatory testing may be necessary for epidemiological  and / or clinical management purposes  to differentiate between  SARS-CoV-2 and other Sarbecovirus currently known to infect humans.  If clinically indicated additional testing with an alternate test  methodology [phone]) is advised. The SARS-CoV-2 RNA is generally  detectable in upper and lower respiratory sp ecimens during the acute  phase of infection. The expected result is Negative. Fact Sheet for Patients:  StrictlyIdeas.no Fact Sheet for Healthcare Providers: BankingDealers.co.za This test is not yet approved or cleared by the [place] FDA and has been authorized for detection and/or diagnosis of SARS-CoV-2 by FDA under an Emergency Use Authorization (EUA).  This EUA will remain in effect (meaning this test can be used) for the duration of the COVID-19 declaration under Section 564(b)(1) of the Act, 21 U.S.C. section 360bbb-3(b)(1), unless the authorization is terminated or revoked sooner. Performed at [facility], [address] [place], [place] [address]   Urine culture     Status: None   Collection Time: 10/03/2018  7:17 PM  Result Value Ref Range Status   Specimen Description   Final    URINE, RANDOM Performed at [facility], [address], [place], [place] [address]    Special Requests   Final    Normal Performed at [facility], [address] [place], [place] [address]    Culture   Final    NO GROWTH Performed at [facility], [place] [address] [place], [place] [address]    Report Status 10/28/2018 FINAL  Final  MRSA PCR Screening     Status: None   Collection Time: 10/27/18  6:41 AM  Result Value Ref Range Status   MRSA by PCR NEGATIVE NEGATIVE Final    Comment:        The GeneXpert MRSA Assay (FDA approved for NASAL  specimens only), is one component of a comprehensive MRSA colonization surveillance program. It is not intended to diagnose MRSA infection nor to guide or monitor treatment for MRSA infections. Performed at [facility], [address] [place], [place] [address]     Coagulation Studies: No results for input(s): LABPROT, INR in the last 72 hours.  Urinalysis: Recent Labs    10/12/2018 1917  COLORURINE YELLOW*  LABSPEC 1.019  PHURINE 5.0  GLUCOSEU NEGATIVE  HGBUR NEGATIVE  BILIRUBINUR NEGATIVE  KETONESUR NEGATIVE  PROTEINUR NEGATIVE  NITRITE NEGATIVE  LEUKOCYTESUR NEGATIVE      Imaging: Dg Abd 1 View  Result Date: 10/27/2018 CLINICAL DATA:  Nasogastric tube placement. EXAM: ABDOMEN - 1 VIEW COMPARISON:  Acute abdominal series 10/06/2018. FINDINGS: Nasogastric tube terminates in the stomach with the side port well beyond the gastroesophageal junction. Gaseous distention of small bowel and colon, partially imaged. IMPRESSION: Nasogastric tube terminates well within the stomach. Electronically Signed   By: Lorin Picket M.D.   On: 10/27/2018 16:51   Acute Abdominal Series  Result Date: 10/02/2018 CLINICAL  DATA:  Initial evaluation for acute nausea and vomiting for past 3 days. EXAM: DG ABDOMEN ACUTE W/ 1V CHEST COMPARISON:  None available. FINDINGS: Transverse heart size at the upper limits of normal. Mediastinal silhouette within normal limits. Aortic atherosclerosis. Lungs are hypoinflated. Associated mild bibasilar subsegmental atelectasis. No focal infiltrates. No pulmonary edema or pleural effusion. No pneumothorax. Multiple scattered mildly prominent gas-filled loops of small bowel seen within the mid and left abdomen, nonspecific, but could reflect sequelae of an underlying enteritis. No overt evidence for obstruction. Large volume retained stool within the right colon, suggesting constipation. No visible free air. Punctate 3 mm calcifications overlying the left  renal shadow, suggesting nephrolithiasis. No acute osseous abnormality. Prior fusion at L4-5 noted. IMPRESSION: 1. Scattered mildly prominent gas-filled loops of small bowel within the mid and left abdomen, nonspecific, but could reflect sequelae of an underlying enteritis. No overt evidence for obstruction. 2. Large volume retained stool within the right colon, suggesting constipation. 3. Possible punctate left renal nephrolithiasis. 4. Shallow lung inflation with mild bibasilar atelectasis. No other active cardiopulmonary disease. Electronically Signed   By: Jeannine Boga M.D.   On: 10/04/2018 20:35     Medications:   . sodium chloride 125 mL/hr at 10/28/18 0111   . bisacodyl  10 mg Rectal Daily  . docusate sodium  100 mg Oral BID  . feeding supplement  1 Container Oral BID BM  . feeding supplement (ENSURE ENLIVE)  237 mL Oral Q24H  . ferrous sulfate  325 mg Oral BID  . folic acid  1 mg Oral Daily  . magnesium oxide  200 mg Oral Daily  . Melatonin  2.5 mg Oral QHS  . metoprolol tartrate  50 mg Oral BID  . mirtazapine  30 mg Oral QHS  . multivitamin with minerals  1 tablet Oral Daily  . OLANZapine  10 mg Oral QHS  . OLANZapine  20 mg Oral QHS  . pantoprazole (PROTONIX) IV  40 mg Intravenous Q12H  . rOPINIRole  0.5 mg Oral QHS  . thiamine  100 mg Oral Daily   acetaminophen **OR** acetaminophen, ondansetron **OR** ondansetron (ZOFRAN) IV, polyethylene glycol  Assessment/ Plan:  Ms. Holly Novak is a 83 y.o.  caucasian female with dementia, pancytopenia, hypertension, myelodysplastic syndrome, Bipolar disorder, who was admitted to [facility] on 10/14/2018 for Hyponatremia [E87.1] Abdominal pain [R10.9] Non-intractable vomiting with nausea, unspecified vomiting type [R11.2] Acute renal failure superimposed on chronic kidney disease, unspecified CKD stage, unspecified acute renal failure type ([place]) [N17.9, N18.9]  1. Acute renal failure: on chronic kidney disease stage III:  baseline creatinine of 1.26, GFR of 40 on 05/22/18 2. Hyponatremia 3. Anemia  Impression Prerenal azotemia secondary to GI losses and unable to keep food down.  - NGT to suction - Continue IV fluids.  - Not a candidate for dialysis due to dementia.  - Creatinine improved somewhat: down to 3.03 from 3.97.    LOS: [place] 5/27/202010:03 AM   Patient seen and examined with physician assistant. Agree with above plan.   Lavonia Dana, [facility]  5/27/20203:18 PM

## 2018-10-28 NOTE — Progress Notes (Signed)
[facility] NAME: Holly Novak    MR#:  [id number]  DATE OF BIRTH:  [birth year]  SUBJECTIVE:   Pt lethargic. NG+ coffee ground/adark maroon retun NG output >2liters  REVIEW OF SYSTEMS:   Review of Systems  Unable to perform ROS: Dementia   Tolerating Diet:npo Tolerating PT: pending  DRUG ALLERGIES:  No Known Allergies  VITALS:  Blood pressure (!) 125/53, pulse (!) 105, temperature 99.5 F (37.5 C), temperature source Oral, resp. rate 16, height 5\' 6"  (1.676 m), weight 60.9 kg, SpO2 94 %.  PHYSICAL EXAMINATION:   Physical Examlimited  GENERAL:  83 y.o.-year-old patient lying in the bed with no acute distress. Appears chronically/critically ill EYES: Pupils equal, round, reactive to light and accommodation. No scleral icterus. HEENT: Head atraumatic, noramocephalic. Oropharynx and nasopharynx clear. Oral mucosa dry NG+ NECK:  Supple, no jugular venous distention. No thyroid enlargement, no tenderness.  LUNGS: Normal breath sounds bilaterally, no wheezing, rales, rhonchi. No use of accessory muscles of respiration.  CARDIOVASCULAR: S1, S2 normal. No murmurs, rubs, or gallops.  ABDOMEN: Soft, nontender, +distended.No organomegaly or mass.  EXTREMITIES: No cyanosis, clubbing or edema b/l.    NEUROLOGIC: limited exam. Patient is advanced dementia not cooperative. She is quite lethargic PSYCHIATRIC:  patient is alert means lethargic most of the time. SKIN: per nurse documentation  LABORATORY PANEL:  CBC Recent Labs  Lab 10/27/18 0529  WBC 7.5  HGB 7.6*  HCT 21.7*  PLT 135*    Chemistries  Recent Labs  Lab 10/13/2018 1638 10/27/18 0529  NA 125* 125*  K 4.3 4.0  CL 80* 80*  CO2 27 27  GLUCOSE 176* 147*  BUN 67* 78*  CREATININE 3.33* 3.97*  CALCIUM 9.2 9.1  AST 17  --   ALT 9  --   ALKPHOS 50  --   BILITOT 1.1  --    Cardiac Enzymes No results for input(s): TROPONINI in the last 168  hours. RADIOLOGY:  Dg Abd 1 View  Result Date: 10/27/2018 CLINICAL DATA:  Nasogastric tube placement. EXAM: ABDOMEN - 1 VIEW COMPARISON:  Acute abdominal series 10/17/2018. FINDINGS: Nasogastric tube terminates in the stomach with the side port well beyond the gastroesophageal junction. Gaseous distention of small bowel and colon, partially imaged. IMPRESSION: Nasogastric tube terminates well within the stomach. Electronically Signed   By: Lorin Picket M.D.   On: 10/27/2018 16:51   Acute Abdominal Series  Result Date: 10/03/2018 CLINICAL DATA:  Initial evaluation for acute nausea and vomiting for past 3 days. EXAM: DG ABDOMEN ACUTE W/ 1V CHEST COMPARISON:  None available. FINDINGS: Transverse heart size at the upper limits of normal. Mediastinal silhouette within normal limits. Aortic atherosclerosis. Lungs are hypoinflated. Associated mild bibasilar subsegmental atelectasis. No focal infiltrates. No pulmonary edema or pleural effusion. No pneumothorax. Multiple scattered mildly prominent gas-filled loops of small bowel seen within the mid and left abdomen, nonspecific, but could reflect sequelae of an underlying enteritis. No overt evidence for obstruction. Large volume retained stool within the right colon, suggesting constipation. No visible free air. Punctate 3 mm calcifications overlying the left renal shadow, suggesting nephrolithiasis. No acute osseous abnormality. Prior fusion at L4-5 noted. IMPRESSION: 1. Scattered mildly prominent gas-filled loops of small bowel within the mid and left abdomen, nonspecific, but could reflect sequelae of an underlying enteritis. No overt evidence for obstruction. 2. Large volume retained stool within the right colon, suggesting constipation. 3. Possible punctate left renal nephrolithiasis. 4. Shallow  lung inflation with mild bibasilar atelectasis. No other active cardiopulmonary disease. Electronically Signed   By: Jeannine Boga M.D.   On: 10/06/2018 20:35    ASSESSMENT AND PLAN:  Judine Novak  is a 83 y.o. female with a known history of late onset Alzheimer's disease, bipolar disorder, GERD, myelodysplastic syndrome, and hypertension.  She presented to the emergency room from assisted living facility with a reported 3-day history of persistent nausea and vomiting with decreased oral intake.  1.  Acute renal failure-BUN 67 and creatinine 3.33 (creat 1.26 in dec 2019)--3.9 -Likely secondary to dehydration/poor po intake/constipation/GI obstruction/?ileus -significant NG out put -GI consult with Dr Vicente Males -CT abd wo contrast - May need renal ultrasound if no improvement with hydration -baseline creat 1.2 -nephrology consultation with Dr. Juleen China -IV iv ppi bid  3. Acute on Chronic anemia-- patient has history of high grade myelodysplastic syndrome-- daughter Ashok Norris she used to follow-up with [facility] -I am not able see any care everywhere records. Patient's daughter does not have any other details. - Continue to monitor hemoglobin hematocrit closely--Daughter has given permission to transfuse if need be  4.  Hyponatremia. -continue IV fluids -Repeat BMP in the a.m.  5.  Generalized weakness - Likely secondary to chronic debilitating disease processes  6. Constipation PO Dulcolax/enema  7. Advanced dementia -per daughter patient has been progressively declining from a dementia standpoint. -She is at [facility]. -Will consider palliative care consultation if patient does not show any improvement  8.DVT  SCD  Spoke with dter Ashok Norris today  Poor prognsis CODE STATUS: DNR  DVT Prophylaxis: SCD  TOTAL TIME TAKING CARE OF THIS PATIENT: 35 minutes.  >50% time spent on counselling and coordination of care  POSSIBLE D/C IN few DAYS, DEPENDING ON CLINICAL CONDITION.  Note: This dictation was prepared with Dragon dictation along with smaller phrase technology. Any transcriptional errors that result from this process are  unintentional.  Fritzi Mandes M.D on 10/28/2018 at 7:41 AM  Between 7am to 6pm - Pager - [phone]  After 6pm go to [email or web address] - password [email or web address]  [facility]  Office  [phone]  CC: Primary care physician; System, Pcp Not InPatient ID: REBBECCA OSUNA, female   DOB: Feb 16, 1936, 83 y.o.   MRN: [id number]

## 2018-10-28 NOTE — TOC Initial Note (Signed)
Transition of Care [facility]) - Initial/Assessment Note    Patient Details  Name: Holly Novak MRN: [id number] Date of Birth: 1935/06/26  [facility]) CM/SW Contact:    Annamaria Boots, [place] Phone Number: 10/28/2018, 2:51 PM  Clinical Narrative: CSW found through chart review that patient is from [facility]. CSW spoke with patient's daughter and she confirms that patient is from [facility] and wants to return. CSW also spoke with Thayer Headings at [facility] regarding patient. Thayer Headings states that patient is a resident in their memory care and can return when ready. Per Thayer Headings, patient was walking independently prior to hospitalization. CSW explained that currently PT is recommending SNF. Thayer Headings would like to accept patient back but wants to see how she progresses. CSW will continue to follow for discharge planning.                Expected Discharge Plan: Memory Care Barriers to Discharge: Continued Medical Work up   Patient Goals and CMS Choice   CMS Medicare.gov Compare Post Acute Care list provided to:: Patient Represenative (must comment)(daughter ) Choice offered to / list presented to : Adult Children  Expected Discharge Plan and Services Expected Discharge Plan: Memory Care       Living arrangements for the past 2 months: [place] Expected Discharge Date: 10/31/18                                    Prior Living Arrangements/Services Living arrangements for the past 2 months: [place] Lives with:: Facility Resident Patient language and need for interpreter reviewed:: Yes Do you feel safe going back to the place where you live?: Yes      Need for Family Participation in Patient Care: Yes (Comment) Care giver support system in place?: Yes (comment)   Criminal Activity/Legal Involvement Pertinent to Current Situation/Hospitalization: No - Comment as needed  Activities of Daily Living Home Assistive Devices/Equipment:  Dentures (specify type), Eyeglasses, Shower chair with back ADL Screening (condition at time of admission) Patient's cognitive ability adequate to safely complete daily activities?: No Is the patient deaf or have difficulty hearing?: No Does the patient have difficulty seeing, even when wearing glasses/contacts?: No Does the patient have difficulty concentrating, remembering, or making decisions?: Yes Patient able to express need for assistance with ADLs?: Yes Does the patient have difficulty dressing or bathing?: No Independently performs ADLs?: No Communication: Independent Dressing (OT): Independent Grooming: Independent Feeding: Independent Bathing: Independent Toileting: Independent with device (comment) In/Out Bed: Independent Walks in Home: Independent Does the patient have difficulty walking or climbing stairs?: Yes Weakness of Legs: None Weakness of Arms/Hands: None  Permission Sought/Granted Permission sought to share information with : Case Freight forwarder, Customer service manager, Family Supports Permission granted to share information with : Yes, Verbal Permission Granted              Emotional Assessment Appearance:: Appears stated age Attitude/Demeanor/Rapport: Lethargic   Orientation: : Oriented to Self Alcohol / Substance Use: Not Applicable Psych Involvement: No (comment)  Admission diagnosis:  Hyponatremia [E87.1] Abdominal pain [R10.9] Non-intractable vomiting with nausea, unspecified vomiting type [R11.2] Acute renal failure superimposed on chronic kidney disease, unspecified CKD stage, unspecified acute renal failure type ([place]) [N17.9, N18.9] Patient Active Problem List   Diagnosis Date Noted  . Acute on chronic renal failure ([place]) 10/10/2018   PCP:  System, Pcp Not In Pharmacy:  No Pharmacies  Listed    Social Determinants of Health (SDOH) Interventions    Readmission Risk Interventions No flowsheet data found.

## 2018-10-28 NOTE — Progress Notes (Signed)
Pharmacy Antibiotic Note  Holly Novak is a 83 y.o. female admitted on 10/23/2018 with pneumonia/?aspiration.  Pharmacy has been consulted for Unasyn dosing. Patient with emesis  Plan: Will begin Unasyn 3 gm IV q12h. F/u renal fxn   Height: 5\' 6"  (167.6 cm) Weight: 134 lb 3.2 oz (60.9 kg) IBW/kg (Calculated) : 59.3  Temp (24hrs), Avg:99.1 F (37.3 C), Min:98.5 F (36.9 C), Max:99.5 F (37.5 C)  Recent Labs  Lab 10/23/2018 1638 10/27/18 0529 10/28/18 0845  WBC 8.6 7.5 4.4  CREATININE 3.33* 3.97* 3.03*    Estimated Creatinine Clearance: 13.9 mL/min (A) (by C-G formula based on SCr of 3.03 mg/dL (H)).    No Known Allergies  Antimicrobials this admission: Unasyn 5/27 >>     Dose adjustments this admission:    Microbiology results: 5/25 BCx: NG   UCx:      Sputum:    5/26 MRSA PCR: neg  Thank you for allowing pharmacy to be a part of this patient's care.  Davinia Riccardi A 10/28/2018 11:08 AM

## 2018-10-28 NOTE — Progress Notes (Addendum)
Patient ID: Holly Novak, female   DOB: [birth year], 83 y.o.   MRN: [id number] patient's hemoglobin is 5.6. We will go ahead and give her two units of blood transfusion. Patient's daughter Holly Novak is aware patient will be getting blood transfusion she has given permission to do so.  CT of the abdomen shows possible small bowel obstruction transition point noted in the left pelvis. I have spoken with Thedore Mins from surgery and will have Dr. Rosana Hoes see patient in evaluation.  Patient had vomited several times yesterday. There is a possibility she aspirated noted bilateral infiltrates on lower lungs noted on CT abdomen. I will start patient on unison for aspiration pneumonia.

## 2018-10-28 NOTE — Consult Note (Addendum)
[facility] SURGICAL CONSULTATION NOTE (initial) - cpt: 66063   HISTORY OF PRESENT ILLNESS (HPI):  83 y.o. female with a history of advance dementia presented to [facility] ED on 05/25 from her assisted-living facility for evaluation of nausea, emesis, and decreased PO intake. History is limited secondary to her advanced dementia and history is obtained primarily through chart review, members of her care team, and family via phone. On presentation, it was reported she had about a 3 day history of nausea, emesis, and decreased PO intake as well as appeared very weak to staff at her care facility. She was worked up in the ED and found to have acute on chronic renal failure (sCr 3.97 - baseline ~2.00) and anemia although she does have a history of MDS. She was admitted to medicine for IVF hydration and further management. During her admission, she had another episode of pretty significant emesis, thus the decision to place NGT was made which reportedly had output of ~1L. CT abdomen/Pelvis was then obtained this morning which was concerning for SBO with transition point in the LLQ/Pelvis. Her daughter reports that to her knowledge she has never had any abdominal surgeries in the past.   Surgery is consulted by hospitalist physician Dr. Fritzi Mandes, MD in this context for evaluation and management of SBO.   PAST MEDICAL HISTORY (PMH):  Past Medical History:  Diagnosis Date  . Arthritis   . Bipolar 1 disorder ([place])   . GERD (gastroesophageal reflux disease)   . High grade myelodysplastic syndrome ([place])   . Hypertension   . Late onset Alzheimer disease ([place])   . Pancytopenia ([place])   . Restless leg syndrome      PAST SURGICAL HISTORY ([place]):  History reviewed. No pertinent surgical history.   MEDICATIONS:  Prior to Admission medications   Medication Sig Start Date End Date Taking? Authorizing Provider  acetaminophen (TYLENOL) 325 MG tablet Take 650 mg by mouth every 6 (six) hours as  needed for mild pain or moderate pain.   Yes [provider]  acetaminophen (TYLENOL) 325 MG tablet Take 650 mg by mouth every 4 (four) hours as needed for fever.   Yes [provider]  acetaminophen (TYLENOL) 500 MG tablet Take 500 mg by mouth every 8 (eight) hours.    Yes [provider]  alum & mag hydroxide-simeth (MAALOX PLUS) 400-400-40 MG/5ML suspension Take 30 mLs by mouth as needed for indigestion.   Yes [provider]  ammonium lactate (LAC-HYDRIN) 12 % lotion Apply 1 application topically daily.    Yes [provider]  bismuth subsalicylate (PEPTO BISMOL) 262 MG/15ML suspension Take 15 mLs by mouth every 2 (two) hours as needed for indigestion (max 6 doses per 24 hours).    Yes [provider]  calcium carbonate (OSCAL) 1500 (600 Ca) MG TABS tablet Take 600 mg of elemental calcium by mouth 2 (two) times daily with a meal.   Yes [provider]  Cholecalciferol (VITAMIN D3) 50 MCG (2000 UT) TABS Take 1 tablet by mouth daily.   Yes [provider]  Cyanocobalamin (VITAMIN B-12) 2500 MCG SUBL Place 1 tablet under the tongue daily.   Yes [provider]  docusate sodium (COLACE) 100 MG capsule Take 100 mg by mouth 2 (two) times daily.   Yes [provider]  ferrous sulfate 325 (65 FE) MG tablet Take 325 mg by mouth 2 (two) times a day.   Yes [provider]  guaiFENesin (ROBITUSSIN) 100 MG/5ML liquid  Take 10 mLs by mouth every 6 (six) hours as needed for cough.    Yes [provider]  loperamide (IMODIUM A-D) 2 MG tablet Take 2 mg by mouth 4 (four) times daily as needed for diarrhea or loose stools (max 4 doses per 12 hours).    Yes [provider]  LORazepam (ATIVAN) 0.5 MG tablet Take 0.5 mg by mouth 2 (two) times daily.    Yes [provider]  LORazepam (ATIVAN) 0.5 MG tablet Take 0.5 mg by mouth at bedtime as needed for sleep.    Yes [provider]   magnesium hydroxide (MILK OF MAGNESIA) 400 MG/5ML suspension Take 30 mLs by mouth daily as needed for mild constipation.   Yes [provider]  Magnesium Oxide 250 MG TABS Take 250 mg by mouth daily.   Yes [provider]  Melatonin 3 MG TABS Take 3 mg by mouth at bedtime.   Yes [provider]  metoprolol tartrate (LOPRESSOR) 50 MG tablet Take 50 mg by mouth 2 (two) times daily.   Yes [provider]  mirtazapine (REMERON) 30 MG tablet Take 30 mg by mouth at bedtime. 09/29/18  Yes [provider]  OLANZapine (ZYPREXA) 10 MG tablet Take 10 mg by mouth at bedtime.    Yes [provider]  OLANZapine (ZYPREXA) 20 MG tablet Take 20 mg by mouth at bedtime.    Yes [provider]  omeprazole (PRILOSEC) 40 MG capsule Take 40 mg by mouth daily before breakfast.    Yes [provider]  rOPINIRole (REQUIP) 0.5 MG tablet Take 0.5 mg by mouth at bedtime.   Yes [provider]  Skin Protectants, Misc. (EUCERIN) cream Apply 1 application topically daily.    Yes [provider]  sodium phosphate Pediatric (FLEET) 3.5-9.5 GM/59ML enema Place 1 enema rectally once as needed for severe constipation (not relieved by milk of mag and prune juice).   Yes [provider]     ALLERGIES:  No Known Allergies   SOCIAL HISTORY:  Social History   Socioeconomic History  . Marital status: Single    Spouse name: Not on file  . Number of children: Not on file  . Years of education: Not on file  . Highest education level: Not on file  Occupational History  . Not on file  Social Needs  . Financial resource strain: Not very hard  . Food insecurity:    Worry: Patient refused    Inability: Patient refused  . Transportation needs:    Medical: Patient refused    Non-medical: Patient refused  Tobacco Use  . Smoking status: Former Research scientist (life sciences)  . Smokeless tobacco: Never Used  Substance and Sexual Activity  . Alcohol use: Not  Currently  . Drug use: Not Currently  . Sexual activity: Not Currently  Lifestyle  . Physical activity:    Days per week: Patient refused    Minutes per session: Patient refused  . Stress: To some extent  Relationships  . Social connections:    Talks on phone: Patient refused    Gets together: Patient refused    Attends religious service: Patient refused    Active member of club or organization: Patient refused    Attends meetings of clubs or organizations: Patient refused    Relationship status: Patient refused  . Intimate partner violence:    Fear of current or ex partner: Patient refused    Emotionally abused: Patient refused    Physically abused: Patient refused  Forced sexual activity: Patient refused  Other Topics Concern  . Not on file  Social History Narrative  . Not on file     FAMILY HISTORY:  Family History  Family history unknown: Yes      REVIEW OF SYSTEMS:  Review of Systems  Unable to perform ROS: Dementia  Constitutional: Negative for fever.  Gastrointestinal: Positive for abdominal pain, nausea and vomiting.  All other systems reviewed and are negative.   VITAL SIGNS:  Temp:  [98.5 F (36.9 C)-99.5 F (37.5 C)] 99.5 F (37.5 C) (05/27 0419) Pulse Rate:  [99-105] 105 (05/27 0419) Resp:  [16-20] 16 (05/27 0419) BP: (125-135)/(47-66) 125/53 (05/27 0419) SpO2:  [78 %-99 %] 94 % (05/27 0419)     Height: 5\' 6"  (167.6 cm) Weight: 60.9 kg BMI (Calculated): 21.67   INTAKE/OUTPUT:  This shift: No intake/output data recorded.  Last 2 shifts: @IOLAST2SHIFTS @   PHYSICAL EXAM:  Physical Exam Constitutional:      Appearance: Normal appearance. She is normal weight. She is not toxic-appearing or diaphoretic.     Comments: Somnolent but will arouse briefly to verbal stimuli, does not participate much in history of examination  HENT:     Head: Normocephalic and atraumatic.     Nose:     Comments: NGT in place Eyes:     General: No scleral icterus.     Conjunctiva/sclera: Conjunctivae normal.  Cardiovascular:     Rate and Rhythm: Regular rhythm. Tachycardia present.     Pulses: Normal pulses.     Heart sounds: No murmur. No friction rub. No gallop.   Pulmonary:     Effort: No respiratory distress.     Breath sounds: Normal breath sounds. No wheezing.  Abdominal:     General: Abdomen is flat. Bowel sounds are normal. There is no distension.     Palpations: Abdomen is soft.     Tenderness: There is no abdominal tenderness. There is no guarding or rebound.     Comments: No appreciable surgical scars present  Genitourinary:    Comments: Deferred Musculoskeletal:     Right lower leg: No edema.     Left lower leg: No edema.  Skin:    General: Skin is warm and dry.     Coloration: Skin is not pale.     Findings: No erythema.  Neurological:     Comments: Somnolent  Psychiatric:     Comments: Unable to assess; does have baseline dementia       Labs:  CBC Latest Ref Rng & Units 10/28/2018 10/27/2018 10/25/2018  WBC 4.0 - 10.5 K/uL 4.4 7.5 8.6  Hemoglobin 12.0 - 15.0 g/dL 5.8(L) 7.6(L) 8.4(L)  Hematocrit 36.0 - 46.0 % 16.8(L) 21.7(L) 23.6(L)  Platelets 150 - 400 K/uL 108(L) 135(L) 159   CMP Latest Ref Rng & Units 10/28/2018 10/27/2018 10/02/2018  Glucose 70 - 99 mg/dL 121(H) 147(H) 176(H)  BUN 8 - 23 mg/dL 81(H) 78(H) 67(H)  Creatinine 0.44 - 1.00 mg/dL 3.03(H) 3.97(H) 3.33(H)  Sodium 135 - 145 mmol/L 128(L) 125(L) 125(L)  Potassium 3.5 - 5.1 mmol/L 3.4(L) 4.0 4.3  Chloride 98 - 111 mmol/L 89(L) 80(L) 80(L)  CO2 22 - 32 mmol/L 27 27 27   Calcium 8.9 - 10.3 mg/dL 8.1(L) 9.1 9.2  Total Protein 6.5 - 8.1 g/dL - - 7.8  Total Bilirubin 0.3 - 1.2 mg/dL - - 1.1  Alkaline Phos 38 - 126 U/L - - 50  AST 15 - 41 U/L - - 17  ALT 0 -  44 U/L - - 9     Imaging studies reviewed:   CT Abdomen/Pelvis (10/28/2018) personally reviewed and radiologist report reviewed and agree with following:  IMPRESSION: 1) Mid to distal small bowel  obstruction with transition point in the left lower abdomen/pelvis, presumably related to adhesions. 2) NG tube tip in the stomach. 3) Bilateral lower lobe airspace opacities with air bronchograms concerning for pneumonia. 4) Cardiomegaly, coronary artery disease.  Aortic atherosclerosis.    Assessment/Plan: (ICD-10's: K22.609) 83 y.o. female with resolved emesis s/p NGT placement secondary to small bowel obstruction with reported transition point in left lower abdomen/pelvis which does appear similar to adhesions although no reported previous abdominal surgeries, which is further complicated by suspected aspiration PNA on CT, advanced Alzheimer's Dementia (she is DNR), anemia secondary to MDS awaiting transfusion today, and acute on chronic renal failure most likely secondary to GI losses.    - NPO + IVF resuscitation  - Agree with NGT decompression; monitor output  - Pain control prn; antiemetics prn  - IV ABx (Unasyn)  - Monitor abdominal examination; on-going bowel function  - Given her dementia, would likely need to reassess with objective studies (ex: daily KUB, NGT output, etc.)   - Typically expect resolution of SBO due to adhesive disease in 48-72 hours with NGT decompression. If SBO does not resolve would typically recommend exploratory laparotomy. However, this patient is DNR with advanced dementia. This was discussed with patient's daughter and she skeptical about pursuing any surgical intervention if recovery/QOL are limited. She is agreeable with continuing NGT decompression and reassessing for now which I think is reasonable.   - Monitor hgb; agree with transfusion as needed  - Monitor renal function; improving; nephrology following  - Further management per primary team  All of the above findings and recommendations were discussed with the patient's family (Daughter - Vance Gather - via phone), and all of patient's family's questions were answered to their expressed  satisfaction.  Thank you for the opportunity to participate in this patient's care.   -- Edison Simon, PA-C [facility] 10/28/2018, 11:06 AM [phone] M-F: 7am - 4pm

## 2018-10-29 ENCOUNTER — Inpatient Hospital Stay: Payer: Medicare Other

## 2018-10-29 LAB — CBC
HCT: 31.4 % — ABNORMAL LOW (ref 36.0–46.0)
Hemoglobin: 10.6 g/dL — ABNORMAL LOW (ref 12.0–15.0)
MCH: 32.1 pg (ref 26.0–34.0)
MCHC: 33.8 g/dL (ref 30.0–36.0)
MCV: 95.2 fL (ref 80.0–100.0)
Platelets: 89 10*3/uL — ABNORMAL LOW (ref 150–400)
RBC: 3.3 MIL/uL — ABNORMAL LOW (ref 3.87–5.11)
RDW: 23.9 % — ABNORMAL HIGH (ref 11.5–15.5)
WBC: 3.9 10*3/uL — ABNORMAL LOW (ref 4.0–10.5)
nRBC: 0 % (ref 0.0–0.2)

## 2018-10-29 LAB — TYPE AND SCREEN
ABO/RH(D): O POS
Antibody Screen: NEGATIVE
Unit division: 0
Unit division: 0

## 2018-10-29 LAB — BPAM RBC
Blood Product Expiration Date: 202006252359
Blood Product Expiration Date: 202006272359
ISSUE DATE / TIME: 202005271327
ISSUE DATE / TIME: 202005271726
Unit Type and Rh: 5100
Unit Type and Rh: 5100

## 2018-10-29 LAB — COMPREHENSIVE METABOLIC PANEL
ALT: 9 U/L (ref 0–44)
AST: 17 U/L (ref 15–41)
Albumin: 2.8 g/dL — ABNORMAL LOW (ref 3.5–5.0)
Alkaline Phosphatase: 33 U/L — ABNORMAL LOW (ref 38–126)
Anion gap: 11 (ref 5–15)
BUN: 66 mg/dL — ABNORMAL HIGH (ref 8–23)
CO2: 22 mmol/L (ref 22–32)
Calcium: 7.8 mg/dL — ABNORMAL LOW (ref 8.9–10.3)
Chloride: 100 mmol/L (ref 98–111)
Creatinine, Ser: 1.92 mg/dL — ABNORMAL HIGH (ref 0.44–1.00)
GFR calc Af Amer: 28 mL/min — ABNORMAL LOW (ref >60)
GFR calc non Af Amer: 24 mL/min — ABNORMAL LOW (ref >60)
Glucose, Bld: 107 mg/dL — ABNORMAL HIGH (ref 70–99)
Potassium: 3.3 mmol/L — ABNORMAL LOW (ref 3.5–5.1)
Sodium: 133 mmol/L — ABNORMAL LOW (ref 135–145)
Total Bilirubin: 1.9 mg/dL — ABNORMAL HIGH (ref 0.3–1.2)
Total Protein: 5.7 g/dL — ABNORMAL LOW (ref 6.5–8.1)

## 2018-10-29 MED ORDER — OLANZAPINE 10 MG PO TBDP
30.0000 mg | ORAL_TABLET | Freq: Every day | ORAL | Status: DC
  Filled 2018-10-29: qty 3

## 2018-10-29 MED ORDER — POTASSIUM CHLORIDE IN NACL 20-0.9 MEQ/L-% IV SOLN
INTRAVENOUS | Status: DC
  Administered 2018-10-29 – 2018-10-31 (×4): via INTRAVENOUS
  Filled 2018-10-29 (×6): qty 1000

## 2018-10-29 MED ORDER — OLANZAPINE 10 MG IM SOLR
30.0000 mg | Freq: Every day | INTRAMUSCULAR | Status: DC
  Administered 2018-10-29 – 2018-10-31 (×3): 30 mg via INTRAMUSCULAR
  Filled 2018-10-29 (×9): qty 30

## 2018-10-29 NOTE — Progress Notes (Signed)
[facility] [place] NAME: Holly Novak    MR#:  [id number]  DATE OF BIRTH:  [birth year]  SUBJECTIVE:   Pt lethargic. NG+ coffee ground NG output   REVIEW OF SYSTEMS:   Review of Systems  Unable to perform ROS: Dementia   Tolerating Diet:npo Tolerating PT: pending  DRUG ALLERGIES:  No Known Allergies  VITALS:  Blood pressure (!) 126/57, pulse 71, temperature 97.6 F (36.4 C), temperature source Oral, resp. rate 14, height 5\' 6"  (1.676 m), weight 60.9 kg, SpO2 96 %.  PHYSICAL EXAMINATION:   Physical Examlimited  GENERAL:  83 y.o.-year-old patient lying in the bed with no acute distress. Appears critically ill EYES: Pupils equal, round, reactive to light and accommodation. No scleral icterus. HEENT: Head atraumatic, noramocephalic. Oropharynx and nasopharynx clear. Oral mucosa dry NG+ NECK:  Supple, no jugular venous distention. No thyroid enlargement, no tenderness.  LUNGS: decreased breath sounds bilaterally, no wheezing, rales, rhonchi. No use of accessory muscles of respiration.  CARDIOVASCULAR: S1, S2 normal. No murmurs, rubs, or gallops. Tachy+ ABDOMEN: Soft, nontender, +distended.No organomegaly or mass.  EXTREMITIES: No cyanosis, clubbing or edema b/l.    NEUROLOGIC: limited exam. Patient is advanced dementia not cooperative. She is quite lethargic PSYCHIATRIC:  patient is lethargic most of the time. SKIN: per nurse documentation  LABORATORY PANEL:  CBC Recent Labs  Lab 10/29/18 0412  WBC 3.9*  HGB 10.6*  HCT 31.4*  PLT 89*    Chemistries  Recent Labs  Lab 10/29/18 0412  NA 133*  K 3.3*  CL 100  CO2 22  GLUCOSE 107*  BUN 66*  CREATININE 1.92*  CALCIUM 7.8*  AST 17  ALT 9  ALKPHOS 33*  BILITOT 1.9*   Cardiac Enzymes No results for input(s): TROPONINI in the last 168 hours. RADIOLOGY:  Ct Abdomen Pelvis Wo Contrast  Result Date: 10/28/2018 CLINICAL DATA:  Nausea, vomiting EXAM: CT ABDOMEN  AND PELVIS WITHOUT CONTRAST TECHNIQUE: Multidetector CT imaging of the abdomen and pelvis was performed following the standard protocol without IV contrast. COMPARISON:  10/27/2018, 10/27/2018 plain films FINDINGS: Lower chest: Bilateral lower lobe airspace opacities with air bronchograms concerning for pneumonia. Cardiomegaly. Scattered coronary artery calcifications. Diffuse distal thoracic aortic calcifications. Hepatobiliary: No focal hepatic abnormality. Gallbladder unremarkable. Pancreas: No focal abnormality or ductal dilatation. Spleen: No focal abnormality.  Normal size. Adrenals/Urinary Tract: No renal or ureteral stones. No renal or adrenal mass lesions seen. Urinary bladder decompressed with Foley catheter in place. Stomach/Bowel: Dilated gas and fluid-filled small bowel loops. Distal small bowel is decompressed. Transition point noted in the left pelvis, best seen on coronal image 39, presumably related to adhesions. Moderate stool burden in the right colon. Appendix is normal. Vascular/Lymphatic: Diffuse aortic and iliac atherosclerosis. No aneurysm. No adenopathy. Reproductive: Uterus and adnexa unremarkable.  No mass. Other: No free fluid or free air. Musculoskeletal: Postoperative changes in the lower lumbar spine and in the left iliac crest. No acute bony abnormality. IMPRESSION: Mid to distal small bowel obstruction with transition point in the left lower abdomen/pelvis, presumably related to adhesions. NG tube tip in the stomach. Bilateral lower lobe airspace opacities with air bronchograms concerning for pneumonia. Cardiomegaly, coronary artery disease.  Aortic atherosclerosis. Electronically Signed   By: Rolm Baptise M.D.   On: 10/28/2018 10:01   Dg Abd 1 View  Result Date: 10/27/2018 CLINICAL DATA:  Nasogastric tube placement. EXAM: ABDOMEN - 1 VIEW COMPARISON:  Acute abdominal series 10/16/2018. FINDINGS: Nasogastric tube  terminates in the stomach with the side port well beyond the  gastroesophageal junction. Gaseous distention of small bowel and colon, partially imaged. IMPRESSION: Nasogastric tube terminates well within the stomach. Electronically Signed   By: Lorin Picket M.D.   On: 10/27/2018 16:51   Dg Abd Portable 2v  Result Date: 10/29/2018 CLINICAL DATA:  Recent small bowel obstruction EXAM: PORTABLE ABDOMEN - 2 VIEW COMPARISON:  Abdominal radiographs Oct 26, 2018 and CT abdomen and pelvis Oct 28, 2018 FINDINGS: Supine and upright images obtained. There remain loops of dilated small bowel without appreciable air-fluid level. Air is seen in the rectum. Moderate stool is seen in the colon. Nasogastric tube tip and side port are in the stomach. There is a small left pleural effusion with left base airspace consolidation. Bones are osteoporotic. There is postoperative change in the lower lumbar spine. IMPRESSION: There remain loops of dilated small bowel without appreciable air-fluid levels. Air is noted in the rectum. Suspect resolving small-bowel obstruction. Nasogastric tube tip and side port in stomach. Electronically Signed   By: Lowella Grip III M.D.   On: 10/29/2018 07:59   ASSESSMENT AND PLAN:  Holly Novak  is a 83 y.o. female with a known history of late onset Alzheimer's disease, bipolar disorder, GERD, myelodysplastic syndrome, and hypertension.  She presented to the emergency room from assisted living facility with a reported 3-day history of persistent nausea and vomiting with decreased oral intake.  1.  Acute renal failure-BUN 67 and creatinine 3.33 (creat 1.26 in dec 2019)--3.9--1.92 -Likely secondary to dehydration/poor po intake/constipation/GI obstruction -significant NG out put -CT abd wo contrast shows SBO/Ileus -baseline creat 1.2 -nephrology consultation with Dr. Juleen China -IV iv ppi bid -Aggressive IVF  3. Acute on Chronic anemia-- patient has history of high grade myelodysplastic syndrome-- daughter Ashok Norris she used to follow-[facility] with  [facility] -I am not able see any care everywhere records. Patient's daughter does not have any other details. - Continue to monitor hemoglobin hematocrit closely--Daughter has given permission to transfuse if need be -hgb 8.4--7.6--5.8--2 units BT--10.6  4.  Hyponatremia. -continue IV fluids 125--125--128--133  5.  Generalized weakness/Adult FTT/ advance dementia - Likely secondary to chronic debilitating disease processes  6. Constipation PO Dulcolax/enema  7. Advanced dementia -per daughter patient has been progressively declining from a dementia standpoint. -She is at [facility].  8.DVT  SCD    Poor prognsis CODE STATUS: DNR  DVT Prophylaxis: SCD  TOTAL TIME TAKING CARE OF THIS PATIENT: 35 minutes.  >50% time spent on counselling and coordination of care  POSSIBLE D/C IN  ? DAYS, DEPENDING ON CLINICAL CONDITION.  Note: This dictation was prepared with Dragon dictation along with smaller phrase technology. Any transcriptional errors that result from this process are unintentional.  Fritzi Mandes M.D on 10/29/2018 at 8:06 AM  Between 7am to 6pm - Pager - [phone]  After 6pm go to [email or web address] - password [email or web address]  [facility]  Office  [phone]  CC: Primary care physician; System, Pcp Not InPatient ID: Holly Novak, female   DOB: 1935/07/31, 83 y.o.   MRN: [id number]

## 2018-10-29 NOTE — Progress Notes (Signed)
[facility]  ROUNDING NOTE   Subjective:   2 Units PRBC transfusion.   Creatinine 1.92 (3.03)  NS at 146mL/hr   Objective:  Vital signs in last 24 hours:  Temp:  [97.5 F (36.4 C)-98.9 F (37.2 C)] 97.6 F (36.4 C) (05/28 0746) Pulse Rate:  [71-113] 71 (05/28 0746) Resp:  [14-18] 14 (05/28 0746) BP: (123-160)/(44-60) 126/57 (05/28 0746) SpO2:  [94 %-98 %] 96 % (05/28 0746)  Weight change:  Filed Weights   10/14/2018 1632 10/12/2018 2131  Weight: 72.6 kg 60.9 kg    Intake/Output: I/O last 3 completed shifts: In: 1931.3 [I.V.:1243.3; Blood:688] Out: 2200 [FGHWE:9937; Emesis/NG output:450]   Intake/Output this shift:  No intake/output data recorded.  Physical Exam: General: NAD, lying in bed.  Head: Normocephalic, atraumatic. dry oral mucosal membranes  Eyes: Anicteric, PERRL  Neck: Supple, trachea midline  Lungs:  Clear to auscultation  Heart: Regular rate and rhythm  Abdomen:  Soft, nontender, hypoactive bowel sounds  Extremities:  No peripheral edema.  Neurologic: Alert to self,follows basic instructions   Skin: No lesions  GU +Foley Catheter     Basic Metabolic Panel: Recent Labs  Lab 10/15/2018 1638 10/27/18 0529 10/28/18 0845 10/29/18 0412  NA 125* 125* 128* 133*  K 4.3 4.0 3.4* 3.3*  CL 80* 80* 89* 100  CO2 27 27 27 22   GLUCOSE 176* 147* 121* 107*  BUN 67* 78* 81* 66*  CREATININE 3.33* 3.97* 3.03* 1.92*  CALCIUM 9.2 9.1 8.1* 7.8*    Liver Function Tests: Recent Labs  Lab 10/29/2018 1638 10/29/18 0412  AST 17 17  ALT 9 9  ALKPHOS 50 33*  BILITOT 1.1 1.9*  PROT 7.8 5.7*  ALBUMIN 4.6 2.8*   Recent Labs  Lab 11/01/2018 1638  LIPASE 23   No results for input(s): AMMONIA in the last 168 hours.  CBC: Recent Labs  Lab 10/19/2018 1638 10/27/18 0529 10/28/18 0845 10/29/18 0412  WBC 8.6 7.5 4.4 3.9*  HGB 8.4* 7.6* 5.8* 10.6*  HCT 23.6* 21.7* 16.8* 31.4*  MCV 106.8* 108.0* 109.8* 95.2  PLT 159 135* 108* 89*    Cardiac  Enzymes: No results for input(s): CKTOTAL, CKMB, CKMBINDEX, TROPONINI in the last 168 hours.  BNP: Invalid input(s): POCBNP  CBG: Recent Labs  Lab 10/19/2018 1633  GLUCAP 165*    Microbiology: Results for orders placed or performed during the hospital encounter of 10/25/2018  SARS Coronavirus 2 (CEPHEID - Performed in [place] hospital lab), Hosp Order     Status: None   Collection Time: 10/30/2018  7:17 PM  Result Value Ref Range Status   SARS Coronavirus 2 NEGATIVE NEGATIVE Final    Comment: (NOTE) If result is NEGATIVE SARS-CoV-2 target nucleic acids are NOT DETECTED. The SARS-CoV-2 RNA is generally detectable in upper and lower  respiratory specimens during the acute phase of infection. The lowest  concentration of SARS-CoV-2 viral copies this assay can detect is 250  copies / mL. A negative result does not preclude SARS-CoV-2 infection  and should not be used as the sole basis for treatment or other  patient management decisions.  A negative result may occur with  improper specimen collection / handling, submission of specimen other  than nasopharyngeal swab, presence of viral mutation(s) within the  areas targeted by this assay, and inadequate number of viral copies  (<250 copies / mL). A negative result must be combined with clinical  observations, patient history, and epidemiological information. If result is POSITIVE SARS-CoV-2 target nucleic  acids are DETECTED. The SARS-CoV-2 RNA is generally detectable in upper and lower  respiratory specimens dur ing the acute phase of infection.  Positive  results are indicative of active infection with SARS-CoV-2.  Clinical  correlation with patient history and other diagnostic information is  necessary to determine patient infection status.  Positive results do  not rule out bacterial infection or co-infection with other viruses. If result is PRESUMPTIVE POSTIVE SARS-CoV-2 nucleic acids MAY BE PRESENT.   A presumptive positive  result was obtained on the submitted specimen  and confirmed on repeat testing.  While 2019 novel coronavirus  (SARS-CoV-2) nucleic acids may be present in the submitted sample  additional confirmatory testing may be necessary for epidemiological  and / or clinical management purposes  to differentiate between  SARS-CoV-2 and other Sarbecovirus currently known to infect humans.  If clinically indicated additional testing with an alternate test  methodology [phone]) is advised. The SARS-CoV-2 RNA is generally  detectable in upper and lower respiratory sp ecimens during the acute  phase of infection. The expected result is Negative. Fact Sheet for Patients:  StrictlyIdeas.no Fact Sheet for Healthcare Providers: BankingDealers.co.za This test is not yet approved or cleared by the [place] FDA and has been authorized for detection and/or diagnosis of SARS-CoV-2 by FDA under an Emergency Use Authorization (EUA).  This EUA will remain in effect (meaning this test can be used) for the duration of the COVID-19 declaration under Section 564(b)(1) of the Act, 21 U.S.C. section 360bbb-3(b)(1), unless the authorization is terminated or revoked sooner. Performed at [facility], [address], [place], [place] [address]   Urine culture     Status: None   Collection Time: 10/08/2018  7:17 PM  Result Value Ref Range Status   Specimen Description   Final    URINE, RANDOM Performed at [facility], [address], [place], [place] [address]    Special Requests   Final    Normal Performed at [facility], [address] [place], [place] [address]    Culture   Final    NO GROWTH Performed at [facility], [place] [address] [place], [place] [address]    Report Status 10/28/2018 FINAL  Final  MRSA PCR Screening     Status: None   Collection Time: 10/27/18  6:41 AM  Result Value Ref Range Status   MRSA by PCR  NEGATIVE NEGATIVE Final    Comment:        The GeneXpert MRSA Assay (FDA approved for NASAL specimens only), is one component of a comprehensive MRSA colonization surveillance program. It is not intended to diagnose MRSA infection nor to guide or monitor treatment for MRSA infections. Performed at [facility], [address] [place], [place] [address]     Coagulation Studies: No results for input(s): LABPROT, INR in the last 72 hours.  Urinalysis: Recent Labs    10/08/2018 [place] 1.019  PHURINE 5.0  GLUCOSEU NEGATIVE  HGBUR NEGATIVE  BILIRUBINUR NEGATIVE  KETONESUR NEGATIVE  PROTEINUR NEGATIVE  NITRITE NEGATIVE  LEUKOCYTESUR NEGATIVE      Imaging: Ct Abdomen Pelvis Wo Contrast  Result Date: 10/28/2018 CLINICAL DATA:  Nausea, vomiting EXAM: CT ABDOMEN AND PELVIS WITHOUT CONTRAST TECHNIQUE: Multidetector CT imaging of the abdomen and pelvis was performed following the standard protocol without IV contrast. COMPARISON:  10/31/2018, 10/27/2018 plain films FINDINGS: Lower chest: Bilateral lower lobe airspace opacities with air bronchograms concerning for pneumonia. Cardiomegaly. Scattered  coronary artery calcifications. Diffuse distal thoracic aortic calcifications. Hepatobiliary: No focal hepatic abnormality. Gallbladder unremarkable. Pancreas: No focal abnormality or ductal dilatation. Spleen: No focal abnormality.  Normal size. Adrenals/Urinary Tract: No renal or ureteral stones. No renal or adrenal mass lesions seen. Urinary bladder decompressed with Foley catheter in place. Stomach/Bowel: Dilated gas and fluid-filled small bowel loops. Distal small bowel is decompressed. Transition point noted in the left pelvis, best seen on coronal image 39, presumably related to adhesions. Moderate stool burden in the right colon. Appendix is normal. Vascular/Lymphatic: Diffuse aortic and iliac atherosclerosis. No aneurysm. No adenopathy. Reproductive:  Uterus and adnexa unremarkable.  No mass. Other: No free fluid or free air. Musculoskeletal: Postoperative changes in the lower lumbar spine and in the left iliac crest. No acute bony abnormality. IMPRESSION: Mid to distal small bowel obstruction with transition point in the left lower abdomen/pelvis, presumably related to adhesions. NG tube tip in the stomach. Bilateral lower lobe airspace opacities with air bronchograms concerning for pneumonia. Cardiomegaly, coronary artery disease.  Aortic atherosclerosis. Electronically Signed   By: Rolm Baptise M.D.   On: 10/28/2018 10:01   Dg Abd 1 View  Result Date: 10/27/2018 CLINICAL DATA:  Nasogastric tube placement. EXAM: ABDOMEN - 1 VIEW COMPARISON:  Acute abdominal series 10/15/2018. FINDINGS: Nasogastric tube terminates in the stomach with the side port well beyond the gastroesophageal junction. Gaseous distention of small bowel and colon, partially imaged. IMPRESSION: Nasogastric tube terminates well within the stomach. Electronically Signed   By: Lorin Picket M.D.   On: 10/27/2018 16:51   Dg Abd Portable 2v  Result Date: 10/29/2018 CLINICAL DATA:  Recent small bowel obstruction EXAM: PORTABLE ABDOMEN - 2 VIEW COMPARISON:  Abdominal radiographs Oct 26, 2018 and CT abdomen and pelvis Oct 28, 2018 FINDINGS: Supine and upright images obtained. There remain loops of dilated small bowel without appreciable air-fluid level. Air is seen in the rectum. Moderate stool is seen in the colon. Nasogastric tube tip and side port are in the stomach. There is a small left pleural effusion with left base airspace consolidation. Bones are osteoporotic. There is postoperative change in the lower lumbar spine. IMPRESSION: There remain loops of dilated small bowel without appreciable air-fluid levels. Air is noted in the rectum. Suspect resolving small-bowel obstruction. Nasogastric tube tip and side port in stomach. Electronically Signed   By: Lowella Grip III M.D.   On:  10/29/2018 07:59     Medications:   . sodium chloride 125 mL/hr at 10/29/18 1218  . ampicillin-sulbactam (UNASYN) IV 3 g (10/29/18 1218)   . bisacodyl  10 mg Rectal Daily  . docusate sodium  100 mg Oral BID  . feeding supplement  1 Container Oral BID BM  . feeding supplement (ENSURE ENLIVE)  237 mL Oral Q24H  . ferrous sulfate  325 mg Oral BID  . magnesium oxide  200 mg Oral Daily  . Melatonin  2.5 mg Oral QHS  . metoprolol tartrate  2.5 mg Intravenous Q6H  . mirtazapine  30 mg Oral QHS  . multivitamin with minerals  1 tablet Oral Daily  . OLANZapine  30 mg Intramuscular QHS  . pantoprazole (PROTONIX) IV  40 mg Intravenous Q12H  . rOPINIRole  0.5 mg Oral QHS   acetaminophen **OR** acetaminophen, ondansetron **OR** ondansetron (ZOFRAN) IV, polyethylene glycol  Assessment/ Plan:  Holly Novak is a 83 y.o.  caucasian female with dementia, pancytopenia, hypertension, myelodysplastic syndrome, Bipolar disorder, who was admitted to [facility] on 10/13/2018 for Hyponatremia [  E87.1] Abdominal pain [R10.9] Non-intractable vomiting with nausea, unspecified vomiting type [R11.2] Acute renal failure superimposed on chronic kidney disease, unspecified CKD stage, unspecified acute renal failure type ([place]) [N17.9, N18.9]  1. Acute renal failure: on chronic kidney disease stage III: baseline creatinine of 1.26, GFR of 40 on 05/22/18 2. Hyponatremia 3. Anemia  Impression Creatinine improving with IV fluids - Replace potassium  - Continue NS with 20KCl   LOS: 3 Holly Novak 5/28/202012:27 PM

## 2018-10-29 NOTE — Progress Notes (Signed)
Physical Therapy Treatment Patient Details Name: Holly Novak MRN: [id number] DOB: 1936-04-16 Today's Date: 10/29/2018    History of Present Illness Pt admitted for acute on chronic renal failure with complaints of N/V x 3 days. History includes Alzheimer's dx, bipolar, GERD, and HTN. Multiple falls per patient report.    PT Comments    Pt presents with deficits in strength, transfers, mobility, gait, balance, and activity tolerance.  Pt required +2 mod A with bed mobility tasks and min A with transfers.  Pt demonstrated fair functional strength during sit to/from stand from an elevated EOB and primarily required min A only to prevent posterior LOB after initial stand. Pt was able to amb 2 x 3' with a RW and min A for stability with a seated rest break between walks.  Pt participated with below therex and throughout the session with good effort.  Pt will benefit from PT services in a SNF setting upon discharge to safely address above deficits for decreased caregiver assistance and eventual return to PLOF.       Follow Up Recommendations  SNF     Equipment Recommendations  Other (comment)(TBD at next venue of care pending discharge to SNF)    Recommendations for Other Services       Precautions / Restrictions Precautions Precautions: Fall Restrictions Weight Bearing Restrictions: No    Mobility  Bed Mobility Overal bed mobility: Needs Assistance Bed Mobility: Supine to Sit;Sit to Supine     Supine to sit: +2 for physical assistance;Mod assist Sit to supine: +2 for physical assistance;Mod assist   General bed mobility comments: Mod verbal and tactile cues for proper sequencing during sup to/from sit  Transfers Overall transfer level: Needs assistance Equipment used: Rolling walker (2 wheeled) Transfers: Sit to/from Stand Sit to Stand: Min assist;From elevated surface         General transfer comment: Fair eccentric and concentric control during transfer training  with decreased physical assistance required this session; min A required only to prevent posterior LOB upon initial stand  Ambulation/Gait Ambulation/Gait assistance: Min assist;Min guard Gait Distance (Feet): 3 Feet Assistive device: Rolling walker (2 wheeled) Gait Pattern/deviations: Step-to pattern;Trunk flexed;Decreased step length - right;Decreased step length - left Gait velocity: decreased   General Gait Details: Slow cadence with short bilateral step length with min A for stability required with pt able to amb 2 x 3' with seated rest break between walks   Stairs             Wheelchair Mobility    Modified Rankin (Stroke Patients Only)       Balance Overall balance assessment: Needs assistance Sitting-balance support: Feet supported Sitting balance-Leahy Scale: Fair Sitting balance - Comments: Posterior instability initially in sitting but improved during the session   Standing balance support: Bilateral upper extremity supported Standing balance-Leahy Scale: Poor Standing balance comment: Min A for stability required                            Cognition Arousal/Alertness: Awake/alert Behavior During Therapy: WFL for tasks assessed/performed Overall Cognitive Status: Within Functional Limits for tasks assessed                                        Exercises Total Joint Exercises Ankle Circles/Pumps: AROM;Both;10 reps Quad Sets: Strengthening;Both;10 reps Heel Slides: AAROM;Both;10 reps Hip ABduction/ADduction: AAROM;Both;10 reps  Straight Leg Raises: AAROM;Both;10 reps Long Arc Quad: AROM;Both;5 reps Knee Flexion: AROM;Both;5 reps Other Exercises Other Exercises: Anterior weight shifting activities in sitting to prevent posterior LOB and facilitate improved transfers    General Comments        Pertinent Vitals/Pain Pain Assessment: No/denies pain    Home Living                      Prior Function             PT Goals (current goals can now be found in the care plan section) Progress towards PT goals: Progressing toward goals    Frequency    Min 2X/week      PT Plan Current plan remains appropriate    Co-evaluation              AM-PAC PT "6 Clicks" Mobility   Outcome Measure  Help needed turning from your back to your side while in a flat bed without using bedrails?: A Lot Help needed moving from lying on your back to sitting on the side of a flat bed without using bedrails?: A Lot Help needed moving to and from a bed to a chair (including a wheelchair)?: A Little Help needed standing up from a chair using your arms (e.g., wheelchair or bedside chair)?: A Little Help needed to walk in hospital room?: A Lot Help needed climbing 3-5 steps with a railing? : Total 6 Click Score: 13    End of Session Equipment Utilized During Treatment: Gait belt Activity Tolerance: Patient tolerated treatment well Patient left: in bed;with bed alarm set;with call bell/phone within reach Nurse Communication: Mobility status PT Visit Diagnosis: Unsteadiness on feet (R26.81);Muscle weakness (generalized) (M62.81);Difficulty in walking, not elsewhere classified (R26.2)     Time: 5038-8828 PT Time Calculation (min) (ACUTE ONLY): 25 min  Charges:  $Therapeutic Exercise: 8-22 mins $Therapeutic Activity: 8-22 mins                     D. Scott Maxi Carreras PT, DPT 10/29/18, 11:47 AM

## 2018-10-29 NOTE — Progress Notes (Signed)
Patient ID: Holly Novak, female   DOB: [birth year], 83 y.o.   MRN: [id number] Spoke with pt's dter Ashok Norris and she wants her mother to be full code. She will try to talk with her tomorrow again and see if she wants to change it .  Will make pt FULL CODE

## 2018-10-29 NOTE — Progress Notes (Addendum)
[facility] SURGICAL PROGRESS NOTE (cpt [phone])  Hospital Day(s): 3.   Post op day(s):  Marland Kitchen   Interval History: Patient seen and examined, no acute events or new complaints overnight. History of dementia limits history although patient does not report and abdominal pain, nausea, or emesis. She does not believe she has passed flatus. No NGT output recorded in chart. PT at bedside to work with patient.   Review of Systems:  ROS limited secondary to history of dementia Gastrointestinal: denies abdominal pain, N/V, or diarrhea/and bowel function as per interval history   Vital signs in last 24 hours: [min-max] current  Temp:  [97.5 F (36.4 C)-98.9 F (37.2 C)] 97.6 F (36.4 C) (05/28 0746) Pulse Rate:  [71-113] 71 (05/28 0746) Resp:  [14-18] 14 (05/28 0746) BP: (123-160)/(44-60) 126/57 (05/28 0746) SpO2:  [94 %-98 %] 96 % (05/28 0746)     Height: 5\' 6"  (167.6 cm) Weight: 60.9 kg BMI (Calculated): 21.67   Intake/Output this shift:  No intake/output data recorded.   Physical Exam:  Constitutional: alert, cooperative and no distress  HENT: normocephalic without obvious abnormality, NGT in place, dry mucus membranes  Eyes: EOM's grossly intact and symmetric  Respiratory: breathing non-labored at rest  Gastrointestinal: soft, non-tender, and non-distended. No rebound/guarding Musculoskeletal: no edema, motor and sensation grossly intact, NT    Labs:  CBC Latest Ref Rng & Units 10/29/2018 10/28/2018 10/27/2018  WBC 4.0 - 10.5 K/uL 3.9(L) 4.4 7.5  Hemoglobin 12.0 - 15.0 g/dL 10.6(L) 5.8(L) 7.6(L)  Hematocrit 36.0 - 46.0 % 31.4(L) 16.8(L) 21.7(L)  Platelets 150 - 400 K/uL 89(L) 108(L) 135(L)   CMP Latest Ref Rng & Units 10/29/2018 10/28/2018 10/27/2018  Glucose 70 - 99 mg/dL 107(H) 121(H) 147(H)  BUN 8 - 23 mg/dL 66(H) 81(H) 78(H)  Creatinine 0.44 - 1.00 mg/dL 1.92(H) 3.03(H) 3.97(H)  Sodium 135 - 145 mmol/L 133(L) 128(L) 125(L)  Potassium 3.5 - 5.1 mmol/L 3.3(L) 3.4(L)  4.0  Chloride 98 - 111 mmol/L 100 89(L) 80(L)  CO2 22 - 32 mmol/L 22 27 27   Calcium 8.9 - 10.3 mg/dL 7.8(L) 8.1(L) 9.1  Total Protein 6.5 - 8.1 g/dL 5.7(L) - -  Total Bilirubin 0.3 - 1.2 mg/dL 1.9(H) - -  Alkaline Phos 38 - 126 U/L 33(L) - -  AST 15 - 41 U/L 17 - -  ALT 0 - 44 U/L 9 - -     Imaging studies:  KUB (10/29/2018) personally reviewed which show persistent dilated small bowel loops and radiologist report reviewed:  IMPRESSION: There remain loops of dilated small bowel without appreciable air-fluid levels. Air is noted in the rectum. Suspect resolving small-bowel obstruction. Nasogastric tube tip and side port in stomach.    Assessment/Plan: (ICD-10's: K58.609) 83 y.o. female with improved acute on chronic anemia secondary to MDS s/p transfucion, improving acute on chronic renal failure, and still with dilated small bowel loops on KUB this morning secondary to small bowel obstruction with reported transition point in left lower abdomen/pelvis which does appear similar to adhesions although no reported previous abdominal surgeries, which is further complicated by suspected aspiration PNA on CT, advanced Alzheimer's Dementia (she is DNR),    - NPO + IVF resuscitation   - Continue NGT decompression; need to record output   - Pain control prn; antiemetics prn   - IV Abx (Unasyn) for aspiration PNA  - Monitor abdominal examination; on-going bowel function  - Recommend morning KUBs to reassess SBO given her dementia history  - With her  significant comorbidities patient would not be an optimal surgical candidate is SBO does not resolve with conservative management.   - Monitor hgb; responded to transfusion             - Monitor renal function; improving; nephrology following             - Further management per primary team   Attempted to call patient's daughter who I spoke to on the phone with yesterday to update her on patient's clinical condition and goals of care. She was  not available. Will continue to try and update.   All of the above findings and recommendations were discussed with the patient, and the medical team  -- Edison Simon, PA-C [facility] 10/29/2018, 7:54 AM [phone] M-F: 7am - 4pm

## 2018-10-29 NOTE — Care Management Important Message (Signed)
Important Message  Patient Details  Name: Holly Novak MRN: [id number] Date of Birth: 01/31/1936   Medicare Important Message Given:  Yes    Juliann Pulse A Eoghan Belcher 10/29/2018, 11:56 AM

## 2018-10-30 ENCOUNTER — Inpatient Hospital Stay: Payer: Medicare Other

## 2018-10-30 LAB — CBC
HCT: 30.2 % — ABNORMAL LOW (ref 36.0–46.0)
Hemoglobin: 10 g/dL — ABNORMAL LOW (ref 12.0–15.0)
MCH: 32.5 pg (ref 26.0–34.0)
MCHC: 33.1 g/dL (ref 30.0–36.0)
MCV: 98.1 fL (ref 80.0–100.0)
Platelets: 105 10*3/uL — ABNORMAL LOW (ref 150–400)
RBC: 3.08 MIL/uL — ABNORMAL LOW (ref 3.87–5.11)
RDW: 24.7 % — ABNORMAL HIGH (ref 11.5–15.5)
WBC: 4.3 10*3/uL (ref 4.0–10.5)
nRBC: 0 % (ref 0.0–0.2)

## 2018-10-30 NOTE — TOC Progression Note (Signed)
Transition of Care [facility]) - Progression Note    Patient Details  Name: Holly Novak MRN: [id number] Date of Birth: 04-28-1936  Transition of Care [facility]) CM/SW [place], RN Phone Number: 10/30/2018, 3:25 PM  Clinical Narrative:     Clancy Gourd called From the memory care unit where the patient resides, she was wanting a status update and left her phone number for any questions or needs .[phone]  Expected Discharge Plan: Memory Care Barriers to Discharge: Continued Medical Work up  Expected Discharge Plan and Services Expected Discharge Plan: Memory Care       Living arrangements for the past 2 months: [place] Expected Discharge Date: 10/31/18                                     Social Determinants of Health (SDOH) Interventions    Readmission Risk Interventions Readmission Risk Prevention Plan 10/28/2018  Transportation Screening Complete  PCP or Specialist Appt within 3-5 Days Not Complete  HRI or Home Care Consult Complete  Social Work Consult for [place] Planning/Counseling Not Complete  SW consult not completed comments NA

## 2018-10-30 NOTE — Progress Notes (Signed)
Spoke to Dr. Brett Albino about patient's congestion. She said she would be getting a chest xray and asked that I reduce IV fluids to 38mL/hr.

## 2018-10-30 NOTE — Progress Notes (Addendum)
[facility] SURGICAL PROGRESS NOTE (cpt [phone])  Hospital Day(s): 4.   Post op day(s):  Marland Kitchen   Interval History: Patient seen and examined, no acute events or new complaints overnight. History is again limited by the patients history of dementia. She does deny any fever, abdominal pain, nausea, or emesis. No reports of flatus. NGT still with bilious output, still no output recorded in chart. Worked with therapy recommending SNF.  Daughter is present at bedside this morning.  Review of Systems:  ROS limited secondary to: Dementia Constitutional: denies fever Gastrointestinal: denies abdominal pain, N/V, or diarrhea/and bowel function as per interval history   Vital signs in last 24 hours: [min-max] current  Temp:  [97.6 F (36.4 C)-99.3 F (37.4 C)] 98.8 F (37.1 C) (05/29 0732) Pulse Rate:  [71-120] 96 (05/29 0732) Resp:  [14-20] 20 (05/29 0732) BP: (126-153)/(52-62) 147/54 (05/29 0732) SpO2:  [96 %-99 %] 97 % (05/29 0732)     Height: 5\' 6"  (167.6 cm) Weight: 60.9 kg BMI (Calculated): 21.67   Intake/Output this shift:  No intake/output data recorded.    Physical Exam:  Constitutional: alert, cooperative and no distress  HENT: normocephalic without obvious abnormality, NGT in place, dry mucus membranes  Eyes: EOM's grossly intact and symmetric  Respiratory: breathing non-labored at rest  Gastrointestinal: soft, mild tenderness diffusely, and non-distended. No rebound/guarding Genitourinary: foley catheter present Musculoskeletal: no edema, motor and sensation grossly intact, NT    Labs:  CBC Latest Ref Rng & Units 10/29/2018 10/28/2018 10/27/2018  WBC 4.0 - 10.5 K/uL 3.9(L) 4.4 7.5  Hemoglobin 12.0 - 15.0 g/dL 10.6(L) 5.8(L) 7.6(L)  Hematocrit 36.0 - 46.0 % 31.4(L) 16.8(L) 21.7(L)  Platelets 150 - 400 K/uL 89(L) 108(L) 135(L)   CMP Latest Ref Rng & Units 10/29/2018 10/28/2018 10/27/2018  Glucose 70 - 99 mg/dL 107(H) 121(H) 147(H)  BUN 8 - 23 mg/dL 66(H) 81(H)  78(H)  Creatinine 0.44 - 1.00 mg/dL 1.92(H) 3.03(H) 3.97(H)  Sodium 135 - 145 mmol/L 133(L) 128(L) 125(L)  Potassium 3.5 - 5.1 mmol/L 3.3(L) 3.4(L) 4.0  Chloride 98 - 111 mmol/L 100 89(L) 80(L)  CO2 22 - 32 mmol/L 22 27 27   Calcium 8.9 - 10.3 mg/dL 7.8(L) 8.1(L) 9.1  Total Protein 6.5 - 8.1 g/dL 5.7(L) - -  Total Bilirubin 0.3 - 1.2 mg/dL 1.9(H) - -  Alkaline Phos 38 - 126 U/L 33(L) - -  AST 15 - 41 U/L 17 - -  ALT 0 - 44 U/L 9 - -    Imaging studies:   KUB (10/30/2018) personally reviewed still showing persistent small bowel dilation with stool in colon but essentially unchanged from prior. Radiologist report reviewed as well:  IMPRESSION: 1.  NG tube noted with tip in the stomach. 2. Persistent unchanged distended loops of small bowel consistent small bowel obstruction. Stool noted in the colon. 3.  Bibasilar atelectasis.   Assessment/Plan: (ICD-10's: K76.609) 83 y.o. female with persistently dilated small bowel loops on KUB despite 48+ hours of NGT suction secondary to small bowel obstruction with reported transition point in left lower abdomen/pelviswhich does appear similar to adhesionsalthough no reported previous abdominal surgeries, which is further complicated by suspected aspiration PNA on CT, advanced Alzheimer's Dementia (she is DNR), improved anemia secondary to MDS s/p transfusion, and improved acute on chronic renal failure most likely secondary to GI losses on presentation.    - Given her significant co-morbid conditions and debilitation, she is a poor surgical candidate for exploratory laparotomy, however it appears her SBO  is not resolving with conservative measures. Discussed this at bedside with Dr Celine Ahr and the patient's daughter. She will discuss this with her mother and determine whether or not they are interested in surgical intervention.    - NPO + IVF resuscitation              - Continue NGT decompression; need to record output    - Pain control prn;  antiemetics prn              - IV Abx (Unasyn) for aspiration PNA             - Monitor abdominal examination; on-going bowel function; OK to give PR treatments for heavy colonic stool burden, but nothing from above.   - Recommend morning KUBs to reassess SBO given her dementia history  - Monitor hgb; responded to transfusion - Monitor renal function; improving; nephrology following - Further management per primary team    All of the above findings and recommendations were discussed with the medical team   -- Edison Simon, PA-C [facility] 10/30/2018, 7:37 AM [phone] M-F: 7am - 4pm  I saw and evaluated the patient.  I agree with the above documentation, exam, and plan, which I have edited where appropriate. Fredirick Maudlin  9:09 AM

## 2018-10-30 NOTE — Progress Notes (Signed)
Pharmacy Antibiotic Note  [place] Holly Novak is a 83 y.o. female admitted on 10/13/2018 with pneumonia/?aspiration.  Pharmacy has been consulted for Unasyn dosing. Renal function has improved. Patient with emesis  Plan: Will continue Unasyn 3 gm IV q12h. F/u renal fxn   Height: 5\' 6"  (167.6 cm) Weight: 134 lb 3.2 oz (60.9 kg) IBW/kg (Calculated) : 59.3  Temp (24hrs), Avg:98.9 F (37.2 C), Min:98.4 F (36.9 C), Max:99.3 F (37.4 C)  Recent Labs  Lab 10/12/2018 1638 10/27/18 0529 10/28/18 0845 10/29/18 0412 10/30/18 1113  WBC 8.6 7.5 4.4 3.9* 4.3  CREATININE 3.33* 3.97* 3.03* 1.92*  --     Estimated Creatinine Clearance: 21.9 mL/min (A) (by C-G formula based on SCr of 1.92 mg/dL (H)).    No Known Allergies  Antimicrobials this admission: Unasyn 5/27 >>     Dose adjustments this admission:    Microbiology results: 5/25 BCx: NG   UCx:      Sputum:    5/26 MRSA PCR: neg  Thank you for allowing pharmacy to be a part of this patient's care.  Pearla Dubonnet 10/30/2018 12:44 PM

## 2018-10-30 NOTE — Progress Notes (Signed)
[facility]  ROUNDING NOTE   Subjective:   More alert and awake thsi morning.   NS at 185mL/hr   No new labs today  Objective:  Vital signs in last 24 hours:  Temp:  [98.4 F (36.9 C)-99.3 F (37.4 C)] 98.8 F (37.1 C) (05/29 0732) Pulse Rate:  [86-120] 96 (05/29 0732) Resp:  [16-20] 20 (05/29 0732) BP: (126-153)/(52-62) 147/54 (05/29 0732) SpO2:  [96 %-99 %] 97 % (05/29 0732)  Weight change:  Filed Weights   10/15/2018 1632 10/30/2018 2131  Weight: 72.6 kg 60.9 kg    Intake/Output: I/O last 3 completed shifts: In: 1621.8 [I.V.:591.8; Blood:380; NG/GT:350; IV Piggyback:300] Out: 1700 [Urine:1700]   Intake/Output this shift:  Total I/O In: -  Out: 200 [Urine:200]  Physical Exam: General: NAD, lying in bed.  Head: Normocephalic, atraumatic. dry oral mucosal membranes  Eyes: Anicteric, PERRL  Neck: Supple, trachea midline  Lungs:  Clear to auscultation  Heart: Regular rate and rhythm  Abdomen:  Soft, nontender, hypoactive bowel sounds  Extremities:  No peripheral edema.  Neurologic: Alert to self,follows basic instructions   Skin: No lesions  GU +Foley Catheter     Basic Metabolic Panel: Recent Labs  Lab 10/06/2018 1638 10/27/18 0529 10/28/18 0845 10/29/18 0412  NA 125* 125* 128* 133*  K 4.3 4.0 3.4* 3.3*  CL 80* 80* 89* 100  CO2 27 27 27 22   GLUCOSE 176* 147* 121* 107*  BUN 67* 78* 81* 66*  CREATININE 3.33* 3.97* 3.03* 1.92*  CALCIUM 9.2 9.1 8.1* 7.8*    Liver Function Tests: Recent Labs  Lab 10/09/2018 1638 10/29/18 0412  AST 17 17  ALT 9 9  ALKPHOS 50 33*  BILITOT 1.1 1.9*  PROT 7.8 5.7*  ALBUMIN 4.6 2.8*   Recent Labs  Lab 10/02/2018 1638  LIPASE 23   No results for input(s): AMMONIA in the last 168 hours.  CBC: Recent Labs  Lab 10/09/2018 1638 10/27/18 0529 10/28/18 0845 10/29/18 0412 10/30/18 1113  WBC 8.6 7.5 4.4 3.9* 4.3  HGB 8.4* 7.6* 5.8* 10.6* 10.0*  HCT 23.6* 21.7* 16.8* 31.4* 30.2*  MCV 106.8* 108.0*  109.8* 95.2 98.1  PLT 159 135* 108* 89* 105*    Cardiac Enzymes: No results for input(s): CKTOTAL, CKMB, CKMBINDEX, TROPONINI in the last 168 hours.  BNP: Invalid input(s): POCBNP  CBG: Recent Labs  Lab 10/09/2018 1633  GLUCAP 165*    Microbiology: Results for orders placed or performed during the hospital encounter of 10/31/2018  SARS Coronavirus 2 (CEPHEID - Performed in [place] hospital lab), Hosp Order     Status: None   Collection Time: 10/27/2018  7:17 PM  Result Value Ref Range Status   SARS Coronavirus 2 NEGATIVE NEGATIVE Final    Comment: (NOTE) If result is NEGATIVE SARS-CoV-2 target nucleic acids are NOT DETECTED. The SARS-CoV-2 RNA is generally detectable in upper and lower  respiratory specimens during the acute phase of infection. The lowest  concentration of SARS-CoV-2 viral copies this assay can detect is 250  copies / mL. A negative result does not preclude SARS-CoV-2 infection  and should not be used as the sole basis for treatment or other  patient management decisions.  A negative result may occur with  improper specimen collection / handling, submission of specimen other  than nasopharyngeal swab, presence of viral mutation(s) within the  areas targeted by this assay, and inadequate number of viral copies  (<250 copies / mL). A negative result must be combined  with clinical  observations, patient history, and epidemiological information. If result is POSITIVE SARS-CoV-2 target nucleic acids are DETECTED. The SARS-CoV-2 RNA is generally detectable in upper and lower  respiratory specimens dur ing the acute phase of infection.  Positive  results are indicative of active infection with SARS-CoV-2.  Clinical  correlation with patient history and other diagnostic information is  necessary to determine patient infection status.  Positive results do  not rule out bacterial infection or co-infection with other viruses. If result is PRESUMPTIVE  POSTIVE SARS-CoV-2 nucleic acids MAY BE PRESENT.   A presumptive positive result was obtained on the submitted specimen  and confirmed on repeat testing.  While 2019 novel coronavirus  (SARS-CoV-2) nucleic acids may be present in the submitted sample  additional confirmatory testing may be necessary for epidemiological  and / or clinical management purposes  to differentiate between  SARS-CoV-2 and other Sarbecovirus currently known to infect humans.  If clinically indicated additional testing with an alternate test  methodology [phone]) is advised. The SARS-CoV-2 RNA is generally  detectable in upper and lower respiratory sp ecimens during the acute  phase of infection. The expected result is Negative. Fact Sheet for Patients:  StrictlyIdeas.no Fact Sheet for Healthcare Providers: BankingDealers.co.za This test is not yet approved or cleared by the [place] FDA and has been authorized for detection and/or diagnosis of SARS-CoV-2 by FDA under an Emergency Use Authorization (EUA).  This EUA will remain in effect (meaning this test can be used) for the duration of the COVID-19 declaration under Section 564(b)(1) of the Act, 21 U.S.C. section 360bbb-3(b)(1), unless the authorization is terminated or revoked sooner. Performed at [facility], [address], [place], [place] [address]   Urine culture     Status: None   Collection Time: 10/28/2018  7:17 PM  Result Value Ref Range Status   Specimen Description   Final    URINE, RANDOM Performed at [facility], [address] [place], [place] [address]    Special Requests   Final    Normal Performed at [facility], [address] [place], [place] [address]    Culture   Final    NO GROWTH Performed at [facility], [place] [address], [place], [place] [address]    Report Status 10/28/2018 FINAL  Final  MRSA PCR Screening     Status: None    Collection Time: 10/27/18  6:41 AM  Result Value Ref Range Status   MRSA by PCR NEGATIVE NEGATIVE Final    Comment:        The GeneXpert MRSA Assay (FDA approved for NASAL specimens only), is one component of a comprehensive MRSA colonization surveillance program. It is not intended to diagnose MRSA infection nor to guide or monitor treatment for MRSA infections. Performed at [facility], [address] [place], [place] [address]     Coagulation Studies: No results for input(s): LABPROT, INR in the last 72 hours.  Urinalysis: No results for input(s): COLORURINE, LABSPEC, PHURINE, GLUCOSEU, HGBUR, BILIRUBINUR, KETONESUR, PROTEINUR, UROBILINOGEN, NITRITE, LEUKOCYTESUR in the last 72 hours.  Invalid input(s): APPERANCEUR    Imaging: Dg Abd Portable 2v  Result Date: 10/30/2018 CLINICAL DATA:  Small-bowel obstruction. EXAM: PORTABLE ABDOMEN - 2 VIEW COMPARISON:  10/29/2018. FINDINGS: NG tube noted with tip over the stomach. Distended loops of small bowel are again noted without interim change. Stool in the colon. Pelvic calcifications consistent phleboliths. Diffuse osteopenia. Degenerative changes lumbar spine and both hips. Prior  lower lumbar fusion. Bibasilar atelectasis. IMPRESSION: 1.  NG tube noted with tip in the stomach. 2. Persistent unchanged distended loops of small bowel consistent small bowel obstruction. Stool noted in the colon. 3.  Bibasilar atelectasis. Electronically Signed   By: Marcello Moores  Register   On: 10/30/2018 06:10   Dg Abd Portable 2v  Result Date: 10/29/2018 CLINICAL DATA:  Recent small bowel obstruction EXAM: PORTABLE ABDOMEN - 2 VIEW COMPARISON:  Abdominal radiographs Oct 26, 2018 and CT abdomen and pelvis Oct 28, 2018 FINDINGS: Supine and upright images obtained. There remain loops of dilated small bowel without appreciable air-fluid level. Air is seen in the rectum. Moderate stool is seen in the colon. Nasogastric tube tip and side port are in the  stomach. There is a small left pleural effusion with left base airspace consolidation. Bones are osteoporotic. There is postoperative change in the lower lumbar spine. IMPRESSION: There remain loops of dilated small bowel without appreciable air-fluid levels. Air is noted in the rectum. Suspect resolving small-bowel obstruction. Nasogastric tube tip and side port in stomach. Electronically Signed   By: Lowella Grip III M.D.   On: 10/29/2018 07:59     Medications:   . 0.9 % NaCl with KCl 20 mEq / L 100 mL/hr at 10/30/18 1017  . ampicillin-sulbactam (UNASYN) IV 3 g (10/30/18 0017)   . bisacodyl  10 mg Rectal Daily  . docusate sodium  100 mg Oral BID  . feeding supplement  1 Container Oral BID BM  . feeding supplement (ENSURE ENLIVE)  237 mL Oral Q24H  . ferrous sulfate  325 mg Oral BID  . magnesium oxide  200 mg Oral Daily  . Melatonin  2.5 mg Oral QHS  . metoprolol tartrate  2.5 mg Intravenous Q6H  . mirtazapine  30 mg Oral QHS  . multivitamin with minerals  1 tablet Oral Daily  . OLANZapine  30 mg Intramuscular QHS  . pantoprazole (PROTONIX) IV  40 mg Intravenous Q12H  . rOPINIRole  0.5 mg Oral QHS   acetaminophen **OR** acetaminophen, ondansetron **OR** ondansetron (ZOFRAN) IV, polyethylene glycol  Assessment/ Plan:  Ms. Holly Novak is a 83 y.o.  caucasian female with dementia, pancytopenia, hypertension, myelodysplastic syndrome, Bipolar disorder, who was admitted to [facility] on 10/18/2018 for small bowel obstruction  1. Acute renal failure: on chronic kidney disease stage III: baseline creatinine of 1.26, GFR of 40 on 05/22/18 2. Hyponatremia 3. Anemia - myelodysplastic syndrome.  4. Hypokalemia  Impression Creatinine improving with IV fluids - Replace potassium  - Continue NS with 20KCl - Labs tomorrow.    LOS: 4 Mahdi Frye 5/29/202012:16 PM

## 2018-10-30 NOTE — Progress Notes (Signed)
[facility] [place] NAME: Holly Novak    MR#:  [id number]  DATE OF BIRTH:  [birth year]  SUBJECTIVE:   Pt more awake today. Answered some of my basic questions. She wants to drink Pepsi. Still continues with NG output.  REVIEW OF SYSTEMS:   Review of Systems  Unable to perform ROS: Dementia   Tolerating Diet:npo Tolerating PT: pending  DRUG ALLERGIES:  No Known Allergies  VITALS:  Blood pressure (!) 147/54, pulse 96, temperature 98.8 F (37.1 C), temperature source Oral, resp. rate 20, height 5\' 6"  (1.676 m), weight 60.9 kg, SpO2 97 %.  PHYSICAL EXAMINATION:   Physical Examlimited  GENERAL:  83 y.o.-year-old patient lying in the bed with no acute distress. Appears critically ill EYES: Pupils equal, round, reactive to light and accommodation. No scleral icterus. HEENT: Head atraumatic, noramocephalic. Oropharynx and nasopharynx clear. Oral mucosa dry NG+ NECK:  Supple, no jugular venous distention. No thyroid enlargement, no tenderness.  LUNGS: decreased breath sounds bilaterally, no wheezing, rales, rhonchi. No use of accessory muscles of respiration.  CARDIOVASCULAR: S1, S2 normal. No murmurs, rubs, or gallops. Tachy+ ABDOMEN: Soft, nontender, +distended.No organomegaly or mass.  EXTREMITIES: No cyanosis, clubbing or edema b/l.    NEUROLOGIC: limited exam. Patient is advanced dementia. Grossly nonfocal PSYCHIATRIC:  patient awakens on VC. SKIN: per nurse documentation  LABORATORY PANEL:  CBC Recent Labs  Lab 10/29/18 0412  WBC 3.9*  HGB 10.6*  HCT 31.4*  PLT 89*    Chemistries  Recent Labs  Lab 10/29/18 0412  NA 133*  K 3.3*  CL 100  CO2 22  GLUCOSE 107*  BUN 66*  CREATININE 1.92*  CALCIUM 7.8*  AST 17  ALT 9  ALKPHOS 33*  BILITOT 1.9*   Cardiac Enzymes No results for input(s): TROPONINI in the last 168 hours. RADIOLOGY:  Ct Abdomen Pelvis Wo Contrast  Result Date: 10/28/2018 CLINICAL DATA:   Nausea, vomiting EXAM: CT ABDOMEN AND PELVIS WITHOUT CONTRAST TECHNIQUE: Multidetector CT imaging of the abdomen and pelvis was performed following the standard protocol without IV contrast. COMPARISON:  10/10/2018, 10/27/2018 plain films FINDINGS: Lower chest: Bilateral lower lobe airspace opacities with air bronchograms concerning for pneumonia. Cardiomegaly. Scattered coronary artery calcifications. Diffuse distal thoracic aortic calcifications. Hepatobiliary: No focal hepatic abnormality. Gallbladder unremarkable. Pancreas: No focal abnormality or ductal dilatation. Spleen: No focal abnormality.  Normal size. Adrenals/Urinary Tract: No renal or ureteral stones. No renal or adrenal mass lesions seen. Urinary bladder decompressed with Foley catheter in place. Stomach/Bowel: Dilated gas and fluid-filled small bowel loops. Distal small bowel is decompressed. Transition point noted in the left pelvis, best seen on coronal image 39, presumably related to adhesions. Moderate stool burden in the right colon. Appendix is normal. Vascular/Lymphatic: Diffuse aortic and iliac atherosclerosis. No aneurysm. No adenopathy. Reproductive: Uterus and adnexa unremarkable.  No mass. Other: No free fluid or free air. Musculoskeletal: Postoperative changes in the lower lumbar spine and in the left iliac crest. No acute bony abnormality. IMPRESSION: Mid to distal small bowel obstruction with transition point in the left lower abdomen/pelvis, presumably related to adhesions. NG tube tip in the stomach. Bilateral lower lobe airspace opacities with air bronchograms concerning for pneumonia. Cardiomegaly, coronary artery disease.  Aortic atherosclerosis. Electronically Signed   By: Rolm Baptise M.D.   On: 10/28/2018 10:01   Dg Abd Portable 2v  Result Date: 10/30/2018 CLINICAL DATA:  Small-bowel obstruction. EXAM: PORTABLE ABDOMEN - 2 VIEW COMPARISON:  10/29/2018. FINDINGS:  NG tube noted with tip over the stomach. Distended loops of  small bowel are again noted without interim change. Stool in the colon. Pelvic calcifications consistent phleboliths. Diffuse osteopenia. Degenerative changes lumbar spine and both hips. Prior lower lumbar fusion. Bibasilar atelectasis. IMPRESSION: 1.  NG tube noted with tip in the stomach. 2. Persistent unchanged distended loops of small bowel consistent small bowel obstruction. Stool noted in the colon. 3.  Bibasilar atelectasis. Electronically Signed   By: Marcello Moores  Register   On: 10/30/2018 06:10   Dg Abd Portable 2v  Result Date: 10/29/2018 CLINICAL DATA:  Recent small bowel obstruction EXAM: PORTABLE ABDOMEN - 2 VIEW COMPARISON:  Abdominal radiographs Oct 26, 2018 and CT abdomen and pelvis Oct 28, 2018 FINDINGS: Supine and upright images obtained. There remain loops of dilated small bowel without appreciable air-fluid level. Air is seen in the rectum. Moderate stool is seen in the colon. Nasogastric tube tip and side port are in the stomach. There is a small left pleural effusion with left base airspace consolidation. Bones are osteoporotic. There is postoperative change in the lower lumbar spine. IMPRESSION: There remain loops of dilated small bowel without appreciable air-fluid levels. Air is noted in the rectum. Suspect resolving small-bowel obstruction. Nasogastric tube tip and side port in stomach. Electronically Signed   By: Lowella Grip III M.D.   On: 10/29/2018 07:59   ASSESSMENT AND PLAN:  Holly Novak  is a 83 y.o. female with a known history of late onset Alzheimer's disease, bipolar disorder, GERD, myelodysplastic syndrome, and hypertension.  She presented to the emergency room from assisted living facility with a reported 3-day history of persistent nausea and vomiting with decreased oral intake.  1.  Acute renal failure-BUN 67 and creatinine 3.33 (creat 1.26 in dec 2019)--3.9--1.92 -Likely secondary to dehydration/poor po intake/constipation/SBO -significant NG out put -CT abd  wo contrast shows SBO/Ileus -baseline creat 1.2 -nephrology consultation with Dr. Juleen China -IV iv ppi bid -Aggressive IVF  2. Small bowel obstruction as noted on CT abdomen and repeat KUB-- change with conservative management -will give suppository and animus to see if patient has bowel movements. -Appreciate surgical input. -Daughter Ashok Norris at bedside-- she and patient does not want to have surgery. -Continue current management.  3. Acute on Chronic anemia-- patient has history of high grade myelodysplastic syndrome-- daughter Ashok Norris she used to follow-up with [facility] -I am not able see any care everywhere records. Patient's daughter does not have any other details. - Continue to monitor hemoglobin hematocrit closely--Daughter has given permission to transfuse if need be -hgb 8.4--7.6--5.8--2 units BT--10.6  4.  Hyponatremia. -continue IV fluids 125--125--128--133  5.  Generalized weakness/Adult FTT/ advance dementia - Likely secondary to chronic debilitating disease processes  6. Constipation  -PO Dulcolax/enema  7. Advanced dementia -per daughter patient has been progressively declining from a dementia standpoint. -She is at [facility].  8.DVT  SCD    Poor prognsis-- daughter understands.  Daughter is at bedside today.  CODE STATUS: full code DVT Prophylaxis: SCD  TOTAL TIME TAKING CARE OF THIS PATIENT: 30 minutes.  >50% time spent on counselling and coordination of care  POSSIBLE D/C IN  ? DAYS, DEPENDING ON CLINICAL CONDITION.  Note: This dictation was prepared with Dragon dictation along with smaller phrase technology. Any transcriptional errors that result from this process are unintentional.  Fritzi Mandes M.D on 10/30/2018 at 9:32 AM  Between 7am to 6pm - Pager - [phone]  After 6pm go to [email or web address] - password  [email or web address]  [facility]  Office  [phone]  CC: Primary care physician; System, Pcp Not InPatient ID: Holly Novak, female   DOB: 07-07-1935, 83 y.o.   MRN: [id number]

## 2018-10-31 ENCOUNTER — Inpatient Hospital Stay: Payer: Medicare Other

## 2018-10-31 LAB — BASIC METABOLIC PANEL
Anion gap: 13 (ref 5–15)
BUN: 35 mg/dL — ABNORMAL HIGH (ref 8–23)
CO2: 15 mmol/L — ABNORMAL LOW (ref 22–32)
Calcium: 8 mg/dL — ABNORMAL LOW (ref 8.9–10.3)
Chloride: 116 mmol/L — ABNORMAL HIGH (ref 98–111)
Creatinine, Ser: 1.37 mg/dL — ABNORMAL HIGH (ref 0.44–1.00)
GFR calc Af Amer: 42 mL/min — ABNORMAL LOW (ref >60)
GFR calc non Af Amer: 36 mL/min — ABNORMAL LOW (ref >60)
Glucose, Bld: 103 mg/dL — ABNORMAL HIGH (ref 70–99)
Potassium: 4.3 mmol/L (ref 3.5–5.1)
Sodium: 144 mmol/L (ref 135–145)

## 2018-10-31 MED ORDER — FUROSEMIDE 10 MG/ML IJ SOLN
INTRAMUSCULAR | Status: AC
  Administered 2018-10-31: 12:00:00 40 mg via INTRAVENOUS
  Filled 2018-10-31: qty 4

## 2018-10-31 MED ORDER — LABETALOL HCL 5 MG/ML IV SOLN
10.0000 mg | Freq: Four times a day (QID) | INTRAVENOUS | Status: DC | PRN
  Administered 2018-10-31: 10 mg via INTRAVENOUS
  Filled 2018-10-31: qty 4

## 2018-10-31 MED ORDER — FUROSEMIDE 10 MG/ML IJ SOLN
40.0000 mg | Freq: Once | INTRAMUSCULAR | Status: AC
  Administered 2018-10-31: 40 mg via INTRAVENOUS

## 2018-10-31 NOTE — Progress Notes (Signed)
RRT called, BP high, HR high, congested.

## 2018-10-31 NOTE — Significant Event (Signed)
Rapid Response Event Note  Overview: Time Called: 1129 Arrival Time: 1132 Event Type: Respiratory  Initial Focused Assessment: called for RR on pt in resp distress, laying in bed, tachypnic, tachycardic, and coarse rhonchi audible    Interventions: Respiratory to NT suction pt, Dr Michail Sermon in room, cxr and lasix ordered. After intervetions pts VSS stabilzed and no more audible Rhonchi noted. Michail Sermon says she will keep patient on 1C and call family to address code status.  Plan of Care (if not transferred): RN Estill Bamberg aware of plan and will call if further assistance needed.  Event Summary: Name of Physician Notified: [place] at 1133    at    Outcome: Stayed in room and stabalized     Roselin Wiemann A

## 2018-10-31 NOTE — Progress Notes (Signed)
Patient very congested while breathing. MD notified, orders to d/c IV fluids.

## 2018-10-31 NOTE — Progress Notes (Signed)
[facility] SURGICAL PROGRESS NOTE (cpt [phone])  Hospital Day(s): 5.   Post op day(s):  Marland Kitchen   Interval History: Patient seen and examined, no acute events or new complaints overnight. History is again limited by the patients history of dementia. She is less alert today and has a fair amount of upper airway congestion.  She does cough with encouragement, but her cough is not effective in clearing the secretions.  NGT output recorded as 350. Attempted enemas to clear fecal burden, but they were not effective.  Review of Systems:  ROS unable to be performed secondary to: Dementia    Vital signs in last 24 hours: [min-max] current  Temp:  [98.7 F (37.1 C)-99.7 F (37.6 C)] 98.7 F (37.1 C) (05/30 0511) Pulse Rate:  [99-135] 121 (05/30 0511) Resp:  [19-20] 20 (05/30 0511) BP: (160-196)/(67-87) 173/87 (05/30 0511) SpO2:  [93 %-96 %] 93 % (05/30 0511)     Height: 5\' 6"  (167.6 cm) Weight: 60.9 kg BMI (Calculated): 21.67   Intake/Output this shift:  Total I/O In: 95 [I.V.:95] Out: -     Physical Exam:  Constitutional: no apparent distress.  HENT: normocephalic without obvious abnormality, NGT in place, dry mucus membranes  Eyes: EOM's grossly intact and symmetric  Respiratory: upper airway secretions, but maintaining her airway Gastrointestinal: soft, non-tender, and non-distended. No rebound/guarding Genitourinary: foley catheter present Musculoskeletal: no edema, motor and sensation grossly intact, NT    Labs:  CBC Latest Ref Rng & Units 10/30/2018 10/29/2018 10/28/2018  WBC 4.0 - 10.5 K/uL 4.3 3.9(L) 4.4  Hemoglobin 12.0 - 15.0 g/dL 10.0(L) 10.6(L) 5.8(L)  Hematocrit 36.0 - 46.0 % 30.2(L) 31.4(L) 16.8(L)  Platelets 150 - 400 K/uL 105(L) 89(L) 108(L)   CMP Latest Ref Rng & Units 10/31/2018 10/29/2018 10/28/2018  Glucose 70 - 99 mg/dL 103(H) 107(H) 121(H)  BUN 8 - 23 mg/dL 35(H) 66(H) 81(H)  Creatinine 0.44 - 1.00 mg/dL 1.37(H) 1.92(H) 3.03(H)  Sodium 135 - 145  mmol/L 144 133(L) 128(L)  Potassium 3.5 - 5.1 mmol/L 4.3 3.3(L) 3.4(L)  Chloride 98 - 111 mmol/L 116(H) 100 89(L)  CO2 22 - 32 mmol/L 15(L) 22 27  Calcium 8.9 - 10.3 mg/dL 8.0(L) 7.8(L) 8.1(L)  Total Protein 6.5 - 8.1 g/dL - 5.7(L) -  Total Bilirubin 0.3 - 1.2 mg/dL - 1.9(H) -  Alkaline Phos 38 - 126 U/L - 33(L) -  AST 15 - 41 U/L - 17 -  ALT 0 - 44 U/L - 9 -    Imaging studies:   KUB (10/31/2018) personally reviewed still showing persistent small bowel dilation with stool in colon but essentially unchanged from prior. Radiology report not yet completed.   Assessment/Plan: (ICD-10's: K40.609) 83 y.o. female with persistently dilated small bowel loops on KUB despite 48+ hours of NGT suction secondary to small bowel obstruction with reported transition point in left lower abdomen/pelviswhich does appear similar to adhesionsalthough no reported previous abdominal surgeries, which is further complicated by suspected aspiration PNA on CT, advanced Alzheimer's Dementia (she is DNR), improved anemia secondary to MDS s/p transfusion, and improved acute on chronic renal failure most likely secondary to GI losses on presentation.    - Given her significant co-morbid conditions and debilitation, she is a poor surgical candidate for exploratory laparotomy, however it appears her SBO is not resolving with conservative measures. Discussed this at bedside yesterday with the patient's daughter. She will discuss this with her mother and determine whether or not they are interested in surgical intervention.    -  NPO + IVF resuscitation              - Continue NGT decompression; need to record output    - Pain control prn; antiemetics prn              - IV Abx (Unasyn) for aspiration PNA             - Monitor abdominal examination; on-going bowel function; OK to give PR treatments for heavy colonic stool burden, but nothing from above.   - Recommend morning KUBs to reassess SBO given her dementia  history  - Monitor hgb; responded to transfusion - Monitor renal function; improving; nephrology following - Further management per primary team    All of the above findings and recommendations were discussed with the medical team  Fredirick Maudlin

## 2018-10-31 NOTE — Progress Notes (Signed)
Lyman attended to code regarding pt. Pt breathing was labored and presented to hv elevated BP. Marland Kitchen Pt is from a SNF. Pt wass responsive to Yes/No questions form care team. Ch communicated w/ staff to contact if pt had further needs.   10/31/18 1200  Clinical Encounter Type  Visited With Patient;Health care provider  Visit Type Code  Referral From Nurse  Consult/Referral To Chaplain  Stress Factors  Patient Stress Factors Health changes;Major life changes  Family Stress Factors None identified

## 2018-10-31 NOTE — Progress Notes (Signed)
RRT cat bedside, HR, BP elevated. Congested breathing. MD came to assess. Orders for lasix, labetolol, chest xray. O2 4L placed on patient. BP and heart rate improved, O2 93%. RT did NT suctioning. Patient's breathing is significantly better, Vital signs improved. Continue to monitor

## 2018-10-31 NOTE — Progress Notes (Signed)
[facility]  ROUNDING NOTE   Subjective:   Rapid response this morning. Given 1 dose of furosemide. IV fluids stopped.   Concern for aspiration  Tmax 100.7  Objective:  Vital signs in last 24 hours:  Temp:  [98.7 F (37.1 C)-100.7 F (38.2 C)] 99.2 F (37.3 C) (05/30 1148) Pulse Rate:  [99-154] 104 (05/30 1152) Resp:  [19-24] 20 (05/30 1148) BP: (119-196)/(58-91) 119/58 (05/30 1152) SpO2:  [78 %-96 %] 93 % (05/30 1152)  Weight change:  Filed Weights   10/28/2018 1632 10/13/2018 2131  Weight: 72.6 kg 60.9 kg    Intake/Output: I/O last 3 completed shifts: In: 2261.5 [I.V.:1594; NG/GT:350; IV Piggyback:317.5] Out: 1800 [Urine:1450; Emesis/NG output:350]   Intake/Output this shift:  Total I/O In: 95 [I.V.:95] Out: 900 [Emesis/NG output:900]  Physical Exam: General: NAD, lying in bed.  Head: Normocephalic, atraumatic. dry oral mucosal membranes  Eyes: Anicteric, PERRL  Neck: Supple, trachea midline  Lungs:  Clear to auscultation  Heart: Regular rate and rhythm  Abdomen:  Soft, nontender, hypoactive bowel sounds  Extremities:  No peripheral edema.  Neurologic: Alert to self,follows basic instructions   Skin: No lesions  GU +Foley Catheter     Basic Metabolic Panel: Recent Labs  Lab 10/23/2018 1638 10/27/18 0529 10/28/18 0845 10/29/18 0412 10/31/18 0754  NA 125* 125* 128* 133* 144  K 4.3 4.0 3.4* 3.3* 4.3  CL 80* 80* 89* 100 116*  CO2 27 27 27 22  15*  GLUCOSE 176* 147* 121* 107* 103*  BUN 67* 78* 81* 66* 35*  CREATININE 3.33* 3.97* 3.03* 1.92* 1.37*  CALCIUM 9.2 9.1 8.1* 7.8* 8.0*    Liver Function Tests: Recent Labs  Lab 10/16/2018 1638 10/29/18 0412  AST 17 17  ALT 9 9  ALKPHOS 50 33*  BILITOT 1.1 1.9*  PROT 7.8 5.7*  ALBUMIN 4.6 2.8*   Recent Labs  Lab 10/24/2018 1638  LIPASE 23   No results for input(s): AMMONIA in the last 168 hours.  CBC: Recent Labs  Lab 10/24/2018 1638 10/27/18 0529 10/28/18 0845 10/29/18 0412  10/30/18 1113  WBC 8.6 7.5 4.4 3.9* 4.3  HGB 8.4* 7.6* 5.8* 10.6* 10.0*  HCT 23.6* 21.7* 16.8* 31.4* 30.2*  MCV 106.8* 108.0* 109.8* 95.2 98.1  PLT 159 135* 108* 89* 105*    Cardiac Enzymes: No results for input(s): CKTOTAL, CKMB, CKMBINDEX, TROPONINI in the last 168 hours.  BNP: Invalid input(s): POCBNP  CBG: Recent Labs  Lab 10/20/2018 1633  GLUCAP 165*    Microbiology: Results for orders placed or performed during the hospital encounter of 10/10/2018  SARS Coronavirus 2 (CEPHEID - Performed in [facility] hospital lab), Hosp Order     Status: None   Collection Time: 10/29/2018  7:17 PM  Result Value Ref Range Status   SARS Coronavirus 2 NEGATIVE NEGATIVE Final    Comment: (NOTE) If result is NEGATIVE SARS-CoV-2 target nucleic acids are NOT DETECTED. The SARS-CoV-2 RNA is generally detectable in upper and lower  respiratory specimens during the acute phase of infection. The lowest  concentration of SARS-CoV-2 viral copies this assay can detect is 250  copies / mL. A negative result does not preclude SARS-CoV-2 infection  and should not be used as the sole basis for treatment or other  patient management decisions.  A negative result may occur with  improper specimen collection / handling, submission of specimen other  than nasopharyngeal swab, presence of viral mutation(s) within the  areas targeted by this assay, and inadequate  number of viral copies  (<250 copies / mL). A negative result must be combined with clinical  observations, patient history, and epidemiological information. If result is POSITIVE SARS-CoV-2 target nucleic acids are DETECTED. The SARS-CoV-2 RNA is generally detectable in upper and lower  respiratory specimens dur ing the acute phase of infection.  Positive  results are indicative of active infection with SARS-CoV-2.  Clinical  correlation with patient history and other diagnostic information is  necessary to determine patient infection status.   Positive results do  not rule out bacterial infection or co-infection with other viruses. If result is PRESUMPTIVE POSTIVE SARS-CoV-2 nucleic acids MAY BE PRESENT.   A presumptive positive result was obtained on the submitted specimen  and confirmed on repeat testing.  While 2019 novel coronavirus  (SARS-CoV-2) nucleic acids may be present in the submitted sample  additional confirmatory testing may be necessary for epidemiological  and / or clinical management purposes  to differentiate between  SARS-CoV-2 and other Sarbecovirus currently known to infect humans.  If clinically indicated additional testing with an alternate test  methodology [phone]) is advised. The SARS-CoV-2 RNA is generally  detectable in upper and lower respiratory sp ecimens during the acute  phase of infection. The expected result is Negative. Fact Sheet for Patients:  StrictlyIdeas.no Fact Sheet for Healthcare Providers: BankingDealers.co.za This test is not yet approved or cleared by the [place] FDA and has been authorized for detection and/or diagnosis of SARS-CoV-2 by FDA under an Emergency Use Authorization (EUA).  This EUA will remain in effect (meaning this test can be used) for the duration of the COVID-19 declaration under Section 564(b)(1) of the Act, 21 U.S.C. section 360bbb-3(b)(1), unless the authorization is terminated or revoked sooner. Performed at [facility], [address] [place], [place] [address]   Urine culture     Status: None   Collection Time: 10/19/2018  7:17 PM  Result Value Ref Range Status   Specimen Description   Final    URINE, RANDOM Performed at [facility], [address], [place], [place] [address]    Special Requests   Final    Normal Performed at [facility], [address] [place], [place] [address]    Culture   Final    NO GROWTH Performed at [facility], [place] [address] [place], [place] [address]    Report Status 10/28/2018 FINAL  Final  MRSA PCR Screening     Status: None   Collection Time: 10/27/18  6:41 AM  Result Value Ref Range Status   MRSA by PCR NEGATIVE NEGATIVE Final    Comment:        The GeneXpert MRSA Assay (FDA approved for NASAL specimens only), is one component of a comprehensive MRSA colonization surveillance program. It is not intended to diagnose MRSA infection nor to guide or monitor treatment for MRSA infections. Performed at [facility], [address] [place], [place] [address]     Coagulation Studies: No results for input(s): LABPROT, INR in the last 72 hours.  Urinalysis: No results for input(s): COLORURINE, LABSPEC, PHURINE, GLUCOSEU, HGBUR, BILIRUBINUR, KETONESUR, PROTEINUR, UROBILINOGEN, NITRITE, LEUKOCYTESUR in the last 72 hours.  Invalid input(s): APPERANCEUR    Imaging: Dg Chest 1 View  Result Date: 10/30/2018 CLINICAL DATA:  Increasing shortness of breath. EXAM: CHEST  1 VIEW COMPARISON:  Single-view of the chest 10/20/2018. FINDINGS: The patient has a new small left pleural effusion and basilar airspace disease. The right lung is  clear. No pneumothorax. There is cardiomegaly. Atherosclerosis is identified. NG tube is in place with the tip in the stomach. IMPRESSION: New small left pleural effusion and basilar airspace disease which could be atelectasis or pneumonia. Cardiomegaly. Atherosclerosis. NG tube in good position. Electronically Signed   By: Inge Rise M.D.   On: 10/30/2018 19:49   Dg Abd 1 View  Result Date: 10/31/2018 CLINICAL DATA:  Bowel obstruction.  NG tube in place.  Unresponsive. EXAM: ABDOMEN - 1 VIEW COMPARISON:  10/30/2018 FINDINGS: NG tube tip remains in the fundus of the stomach. Stomach is decompressed. Distended small bowel loops have improved. Stool is present throughout the colon with some colonic gas. There is no obvious free intraperitoneal gas. IMPRESSION: Improving  small bowel distension. Electronically Signed   By: Marybelle Killings M.D.   On: 10/31/2018 09:58   Dg Chest Port 1 View  Result Date: 10/31/2018 CLINICAL DATA:  Rapid response.  Congested breathing EXAM: PORTABLE CHEST 1 VIEW COMPARISON:  10/30/2018 FINDINGS: NG tube extends the stomach. Stable enlarged cardiac silhouette. LEFT pleural effusion similar. No pulmonary edema. IMPRESSION: 1. No interval change. 2. Moderate LEFT pleural effusion.  LEFT lung base poorly evaluated. Electronically Signed   By: Suzy Bouchard M.D.   On: 10/31/2018 12:05   Dg Abd Portable 2v  Result Date: 10/30/2018 CLINICAL DATA:  Small-bowel obstruction. EXAM: PORTABLE ABDOMEN - 2 VIEW COMPARISON:  10/29/2018. FINDINGS: NG tube noted with tip over the stomach. Distended loops of small bowel are again noted without interim change. Stool in the colon. Pelvic calcifications consistent phleboliths. Diffuse osteopenia. Degenerative changes lumbar spine and both hips. Prior lower lumbar fusion. Bibasilar atelectasis. IMPRESSION: 1.  NG tube noted with tip in the stomach. 2. Persistent unchanged distended loops of small bowel consistent small bowel obstruction. Stool noted in the colon. 3.  Bibasilar atelectasis. Electronically Signed   By: Marcello Moores  Register   On: 10/30/2018 06:10     Medications:   . ampicillin-sulbactam (UNASYN) IV 3 g (10/31/18 1120)   . bisacodyl  10 mg Rectal Daily  . docusate sodium  100 mg Oral BID  . feeding supplement  1 Container Oral BID BM  . feeding supplement (ENSURE ENLIVE)  237 mL Oral Q24H  . ferrous sulfate  325 mg Oral BID  . magnesium oxide  200 mg Oral Daily  . Melatonin  2.5 mg Oral QHS  . metoprolol tartrate  2.5 mg Intravenous Q6H  . mirtazapine  30 mg Oral QHS  . multivitamin with minerals  1 tablet Oral Daily  . OLANZapine  30 mg Intramuscular QHS  . pantoprazole (PROTONIX) IV  40 mg Intravenous Q12H  . rOPINIRole  0.5 mg Oral QHS   acetaminophen **OR** acetaminophen, labetalol,  ondansetron **OR** ondansetron (ZOFRAN) IV, polyethylene glycol  Assessment/ Plan:  Holly Novak is a 83 y.o.  caucasian female with dementia, pancytopenia, hypertension, myelodysplastic syndrome, Bipolar disorder, who was admitted to [facility] on 10/24/2018 for small bowel obstruction  1. Acute renal failure: on chronic kidney disease stage III: baseline creatinine of 1.26, GFR of 40 on 05/22/18 2. Metabolic acidosis 3. Anemia - myelodysplastic syndrome. Status post PRBC transfusion 2 units on 5/27  Impression Creatinine improving with IV fluids. Potassium and sodium now at goal - Holding IV fluids. Status post furosemide this morning.    LOS: 5 Haileyann Staiger 5/30/202012:35 PM

## 2018-10-31 NOTE — Progress Notes (Signed)
[place] [facility] [place] NAME: Holly Novak    MR#:  [id number]  DATE OF BIRTH:  10-14-1935  SUBJECTIVE:  CHIEF COMPLAINT:   Chief Complaint  Patient presents with  . Emesis   -Patient had gurgling of her secretions overnight and chest x-ray was stable.  IV fluids were decreased in rate at the time.  Another episode this morning of respiratory distress and rapid response was called. -Deep suctioning revealed some gastric material.  Patient has NG tube to low intermittent suctioning.  REVIEW OF SYSTEMS:  Review of Systems  Unable to perform ROS: Dementia    DRUG ALLERGIES:  No Known Allergies  VITALS:  Blood pressure (!) 119/58, pulse (!) 104, temperature 99.2 F (37.3 C), temperature source Oral, resp. rate 20, height 5\' 6"  (1.676 m), weight 60.9 kg, SpO2 93 %.  PHYSICAL EXAMINATION:  Physical Exam  GENERAL:  83 y.o.-year-old ill-appearing patient lying in the bed with no acute distress.  EYES: Pupils equal, round, reactive to light and accommodation. No scleral icterus. Extraocular muscles intact.  HEENT: Head atraumatic, normocephalic. Oropharynx and nasopharynx clear.  NECK:  Supple, no jugular venous distention. No thyroid enlargement, no tenderness.  LUNGS: Normal breath sounds bilaterally, no wheezing, rales,rhonchi or crepitation. No use of accessory muscles of respiration.  Significantly decreased left basilar breath sounds CARDIOVASCULAR: S1, S2 normal. No murmurs, rubs, or gallops.  ABDOMEN: Soft, nontender, nondistended. Bowel sounds present. No organomegaly or mass.  EXTREMITIES: No  cyanosis, or clubbing.  Bilateral pedal edema noted. NEUROLOGIC: Cranial nerves II through XII are intact.  Follows some simple commands but overall very weak.. Sensation intact. Gait not checked.  PSYCHIATRIC: The patient is alert but not communicating very well SKIN: No obvious rash, lesion, or ulcer.    LABORATORY PANEL:   CBC  Recent Labs  Lab 10/30/18 1113  WBC 4.3  HGB 10.0*  HCT 30.2*  PLT 105*   ------------------------------------------------------------------------------------------------------------------  Chemistries  Recent Labs  Lab 10/29/18 0412 10/31/18 0754  NA 133* 144  K 3.3* 4.3  CL 100 116*  CO2 22 15*  GLUCOSE 107* 103*  BUN 66* 35*  CREATININE 1.92* 1.37*  CALCIUM 7.8* 8.0*  AST 17  --   ALT 9  --   ALKPHOS 33*  --   BILITOT 1.9*  --    ------------------------------------------------------------------------------------------------------------------  Cardiac Enzymes No results for input(s): TROPONINI in the last 168 hours. ------------------------------------------------------------------------------------------------------------------  RADIOLOGY:  Dg Chest 1 View  Result Date: 10/30/2018 CLINICAL DATA:  Increasing shortness of breath. EXAM: CHEST  1 VIEW COMPARISON:  Single-view of the chest 10/23/2018. FINDINGS: The patient has a new small left pleural effusion and basilar airspace disease. The right lung is clear. No pneumothorax. There is cardiomegaly. Atherosclerosis is identified. NG tube is in place with the tip in the stomach. IMPRESSION: New small left pleural effusion and basilar airspace disease which could be atelectasis or pneumonia. Cardiomegaly. Atherosclerosis. NG tube in good position. Electronically Signed   By: Inge Rise M.D.   On: 10/30/2018 19:49   Dg Abd 1 View  Result Date: 10/31/2018 CLINICAL DATA:  Bowel obstruction.  NG tube in place.  Unresponsive. EXAM: ABDOMEN - 1 VIEW COMPARISON:  10/30/2018 FINDINGS: NG tube tip remains in the fundus of the stomach. Stomach is decompressed. Distended small bowel loops have improved. Stool is present throughout the colon with some colonic gas. There is no obvious free intraperitoneal gas. IMPRESSION: Improving small bowel distension. Electronically Signed  By: Marybelle Killings M.D.   On: 10/31/2018 09:58   Dg  Chest Port 1 View  Result Date: 10/31/2018 CLINICAL DATA:  Rapid response.  Congested breathing EXAM: PORTABLE CHEST 1 VIEW COMPARISON:  10/30/2018 FINDINGS: NG tube extends the stomach. Stable enlarged cardiac silhouette. LEFT pleural effusion similar. No pulmonary edema. IMPRESSION: 1. No interval change. 2. Moderate LEFT pleural effusion.  LEFT lung base poorly evaluated. Electronically Signed   By: Suzy Bouchard M.D.   On: 10/31/2018 12:05   Dg Abd Portable 2v  Result Date: 10/30/2018 CLINICAL DATA:  Small-bowel obstruction. EXAM: PORTABLE ABDOMEN - 2 VIEW COMPARISON:  10/29/2018. FINDINGS: NG tube noted with tip over the stomach. Distended loops of small bowel are again noted without interim change. Stool in the colon. Pelvic calcifications consistent phleboliths. Diffuse osteopenia. Degenerative changes lumbar spine and both hips. Prior lower lumbar fusion. Bibasilar atelectasis. IMPRESSION: 1.  NG tube noted with tip in the stomach. 2. Persistent unchanged distended loops of small bowel consistent small bowel obstruction. Stool noted in the colon. 3.  Bibasilar atelectasis. Electronically Signed   By: Marcello Moores  Register   On: 10/30/2018 06:10    EKG:   Orders placed or performed during the hospital encounter of 05/22/18  . ED EKG  . ED EKG  . EKG 12-Lead  . EKG 12-Lead  . EKG 12-Lead  . EKG 12-Lead    ASSESSMENT AND PLAN:   NevaSteinbacheris an83 y.o.femalewith a known history of late onset Alzheimer's disease, bipolar disorder, GERD, myelodysplastic syndrome, and hypertension  presented to the emergency room from assisted living facility with a reported 3-day history of persistent nausea and vomiting with decreased oral intake.  1. Acute renal failure-BUN 67 and creatinine 3.33 on admission -With IV fluids, improved up to 1.3.  However IV fluids have been discontinued due to respiratory distress and patient received a dose of Lasix. -Continue to monitor at this time  -Appreciate nephrology consult  2. Small bowel obstruction as noted on CT abdomen and repeat KUB-- change with conservative management -will give suppository and enemas  . -Appreciate surgical input. -Family requests conservative management and no surgical management at this time -Continue current management.  3.  Acute respiratory distress-secondary to possible aspirations.  Patient has an NG tube, changed to continuous suctioning -Deeper suctioning of her oropharynx as needed -IV fluids stopped.  Lasix IV given.  Continue oxygen support for now -On Unasyn for possible aspiration pneumonia. -Has left pleural effusion, if no improvement, consider thoracentesis on Monday  4. Acute onChronic anemia-- patient has history of high grade myelodysplastic syndrome-- daughter Ashok Norris she used to follow-up with [facility] -I am not able see any care everywhere records. Patient's daughter does not have any other details. - Continue to monitor hemoglobin hematocrit closely--Daughter has given permission to transfuse if need be -Hemoglobin at 10.  Monitor closely  5. Generalized weakness/Adult FTT/ advance dementia - Likely secondary to chronic debilitating disease processes  6. Constipation  -PO Dulcolax/enema  7. Advanced dementia -per daughter patient has been progressively declining from a dementia standpoint. -She is at [facility]. -On high-dose Zyprexa at bedtime  8.DVT  SCD   I have further discussed CODE STATUS with daughter over the phone.  Daughter requesting to visit her mom.  Daughter understands that this might happen again and her mother's condition is very tenuous at this time. Full code at this time.  Palliative care consult has been placed.  All the records are reviewed and case discussed  with Care Management/Social Workerr. Management plans discussed with the patient, family and they are in agreement.  CODE STATUS: Full code  TOTAL TIME TAKING CARE OF  THIS PATIENT: 39 minutes.   POSSIBLE D/C IN 2 DAYS, DEPENDING ON CLINICAL CONDITION.   [name] M.D on 10/31/2018 at 12:21 PM  Between 7am to 6pm - Pager - [phone]  After 6pm go to [email or web address] - password [email or web address] [facility]  Office  [phone]  CC: Primary care physician; System, Pcp Not In

## 2018-11-01 ENCOUNTER — Inpatient Hospital Stay: Payer: Medicare Other

## 2018-11-01 LAB — BASIC METABOLIC PANEL
Anion gap: 12 (ref 5–15)
BUN: 41 mg/dL — ABNORMAL HIGH (ref 8–23)
CO2: 20 mmol/L — ABNORMAL LOW (ref 22–32)
Calcium: 8.3 mg/dL — ABNORMAL LOW (ref 8.9–10.3)
Chloride: 116 mmol/L — ABNORMAL HIGH (ref 98–111)
Creatinine, Ser: 1.37 mg/dL — ABNORMAL HIGH (ref 0.44–1.00)
GFR calc Af Amer: 42 mL/min — ABNORMAL LOW (ref >60)
GFR calc non Af Amer: 36 mL/min — ABNORMAL LOW (ref >60)
Glucose, Bld: 138 mg/dL — ABNORMAL HIGH (ref 70–99)
Potassium: 3.3 mmol/L — ABNORMAL LOW (ref 3.5–5.1)
Sodium: 148 mmol/L — ABNORMAL HIGH (ref 135–145)

## 2018-11-01 LAB — CBC
HCT: 32 % — ABNORMAL LOW (ref 36.0–46.0)
Hemoglobin: 10.6 g/dL — ABNORMAL LOW (ref 12.0–15.0)
MCH: 32.3 pg (ref 26.0–34.0)
MCHC: 33.1 g/dL (ref 30.0–36.0)
MCV: 97.6 fL (ref 80.0–100.0)
Platelets: 115 10*3/uL — ABNORMAL LOW (ref 150–400)
RBC: 3.28 MIL/uL — ABNORMAL LOW (ref 3.87–5.11)
RDW: 23.9 % — ABNORMAL HIGH (ref 11.5–15.5)
WBC: 14 10*3/uL — ABNORMAL HIGH (ref 4.0–10.5)
nRBC: 0 % (ref 0.0–0.2)

## 2018-11-01 LAB — LACTIC ACID, PLASMA: Lactic Acid, Venous: 1.2 mmol/L (ref 0.5–1.9)

## 2018-11-01 LAB — MAGNESIUM: Magnesium: 2.2 mg/dL (ref 1.7–2.4)

## 2018-11-01 LAB — PHOSPHORUS: Phosphorus: 1.7 mg/dL — ABNORMAL LOW (ref 2.5–4.6)

## 2018-11-01 LAB — GLUCOSE, CAPILLARY: Glucose-Capillary: 108 mg/dL — ABNORMAL HIGH (ref 70–99)

## 2018-11-01 MED ORDER — ONDANSETRON HCL 4 MG/2ML IJ SOLN: 4.0000 mg | Freq: Four times a day (QID) | INTRAMUSCULAR | Status: DC | PRN

## 2018-11-01 MED ORDER — BIOTENE DRY MOUTH MT LIQD: 15.0000 mL | OROMUCOSAL | Status: DC | PRN

## 2018-11-01 MED ORDER — GLYCOPYRROLATE 1 MG PO TABS
1.0000 mg | ORAL_TABLET | ORAL | Status: DC | PRN
  Filled 2018-11-01: qty 1

## 2018-11-01 MED ORDER — GLYCOPYRROLATE 0.2 MG/ML IJ SOLN
0.2000 mg | INTRAMUSCULAR | Status: DC | PRN
  Administered 2018-11-01 – 2018-11-04 (×10): 0.2 mg via INTRAVENOUS
  Filled 2018-11-01 (×14): qty 1

## 2018-11-01 MED ORDER — MORPHINE SULFATE (PF) 2 MG/ML IV SOLN
1.0000 mg | INTRAVENOUS | Status: DC | PRN
  Administered 2018-11-01: 1 mg via INTRAVENOUS
  Administered 2018-11-01: 4 mg via INTRAVENOUS
  Administered 2018-11-02 – 2018-11-03 (×7): 2 mg via INTRAVENOUS
  Administered 2018-11-04 (×2): 4 mg via INTRAVENOUS
  Administered 2018-11-04 (×2): 2 mg via INTRAVENOUS
  Administered 2018-11-04: 12:00:00 4 mg via INTRAVENOUS
  Filled 2018-11-01 (×4): qty 1
  Filled 2018-11-01 (×2): qty 2
  Filled 2018-11-01: qty 1
  Filled 2018-11-01: qty 2
  Filled 2018-11-01 (×6): qty 1
  Filled 2018-11-01: qty 2

## 2018-11-01 MED ORDER — LORAZEPAM 1 MG PO TABS: 1.0000 mg | ORAL_TABLET | ORAL | Status: DC | PRN

## 2018-11-01 MED ORDER — POLYVINYL ALCOHOL 1.4 % OP SOLN
1.0000 [drp] | Freq: Four times a day (QID) | OPHTHALMIC | Status: DC | PRN
  Filled 2018-11-01: qty 15

## 2018-11-01 MED ORDER — GLYCOPYRROLATE 0.2 MG/ML IJ SOLN
0.2000 mg | INTRAMUSCULAR | Status: DC | PRN
  Filled 2018-11-01: qty 1

## 2018-11-01 MED ORDER — LORAZEPAM 2 MG/ML IJ SOLN
1.0000 mg | INTRAMUSCULAR | Status: DC | PRN
  Administered 2018-11-01: 1 mg via INTRAVENOUS
  Filled 2018-11-01: qty 1

## 2018-11-01 MED ORDER — ALBUTEROL SULFATE (2.5 MG/3ML) 0.083% IN NEBU: 2.5000 mg | INHALATION_SOLUTION | RESPIRATORY_TRACT | Status: DC | PRN

## 2018-11-01 MED ORDER — ONDANSETRON 4 MG PO TBDP
4.0000 mg | ORAL_TABLET | Freq: Four times a day (QID) | ORAL | Status: DC | PRN
  Filled 2018-11-01: qty 1

## 2018-11-01 MED ORDER — LORAZEPAM 2 MG/ML PO CONC: 1.0000 mg | ORAL | Status: DC | PRN

## 2018-11-01 NOTE — Consult Note (Signed)
PULMONARY / CRITICAL CARE MEDICINE  Name: Holly Novak MRN: [id number] DOB: [birth year]    LOS: 6  Referring Provider: Dr. Jannifer Franklin Reason for Referral: Acute respiratory failure Brief patient description: 83 year old female with baseline dementia who presented nausea and vomiting.  She was found to have small bowel obstruction and conservative treatment was opted for.  This evening patient became acutely hypoxic, with increased work of breathing and tracheal congestion.  She is being transferred to the ICU for further management.  HPI: This is an 83 year old female with a medical history as indicated below who was admitted initially with a small bowel obstruction.  Patient and her daughter opted for conservative treatment.  An NG tube was placed and connected to low intermittent suction and she was given multiple laxatives.  She was also seen by nephrology for acute renal failure and was being seen on a daily basis by surgery.  This evening, rapid response was called because patient became acutely hypoxic with increased work of breathing and increased trach as congestion with concern for severe aspiration.  Thick copious tan looking secretions were suctioned from her airway.  Despite interventions, her respiratory status continued to decline and PCCM was called for consultation for intubation.  After discussing with patient's daughter and POA, she indicated that patient was a DNR prior to hospitalization.  The risk and benefits of intubating patient in light of her current health status and overall chronic disease burden was reviewed with daughter.  She has decided to proceed with comfort measures and avoid all heroic measures.  Past Medical History:  Diagnosis Date  . Arthritis   . Bipolar 1 disorder ([place])   . GERD (gastroesophageal reflux disease)   . High grade myelodysplastic syndrome ([place])   . Hypertension   . Late onset Alzheimer disease ([place])   . Pancytopenia ([place])   . Restless leg  syndrome    History reviewed. No pertinent surgical history. No current facility-administered medications on file prior to encounter.    Current Outpatient Medications on File Prior to Encounter  Medication Sig  . acetaminophen (TYLENOL) 325 MG tablet Take 650 mg by mouth every 6 (six) hours as needed for mild pain or moderate pain.  Marland Kitchen acetaminophen (TYLENOL) 325 MG tablet Take 650 mg by mouth every 4 (four) hours as needed for fever.  Marland Kitchen acetaminophen (TYLENOL) 500 MG tablet Take 500 mg by mouth every 8 (eight) hours.   Marland Kitchen alum & mag hydroxide-simeth (MAALOX PLUS) 400-400-40 MG/5ML suspension Take 30 mLs by mouth as needed for indigestion.  Marland Kitchen ammonium lactate (LAC-HYDRIN) 12 % lotion Apply 1 application topically daily.   Marland Kitchen bismuth subsalicylate (PEPTO BISMOL) 262 MG/15ML suspension Take 15 mLs by mouth every 2 (two) hours as needed for indigestion (max 6 doses per 24 hours).   . calcium carbonate (OSCAL) 1500 (600 Ca) MG TABS tablet Take 600 mg of elemental calcium by mouth 2 (two) times daily with a meal.  . Cholecalciferol (VITAMIN D3) 50 MCG (2000 UT) TABS Take 1 tablet by mouth daily.  . Cyanocobalamin (VITAMIN B-12) 2500 MCG SUBL Place 1 tablet under the tongue daily.  Marland Kitchen docusate sodium (COLACE) 100 MG capsule Take 100 mg by mouth 2 (two) times daily.  . ferrous sulfate 325 (65 FE) MG tablet Take 325 mg by mouth 2 (two) times a day.  Marland Kitchen guaiFENesin (ROBITUSSIN) 100 MG/5ML liquid Take 10 mLs by mouth every 6 (six) hours as needed for cough.   . loperamide (IMODIUM A-D) 2 MG tablet  Take 2 mg by mouth 4 (four) times daily as needed for diarrhea or loose stools (max 4 doses per 12 hours).   . LORazepam (ATIVAN) 0.5 MG tablet Take 0.5 mg by mouth 2 (two) times daily.   Marland Kitchen LORazepam (ATIVAN) 0.5 MG tablet Take 0.5 mg by mouth at bedtime as needed for sleep.   . magnesium hydroxide (MILK OF MAGNESIA) 400 MG/5ML suspension Take 30 mLs by mouth daily as needed for mild constipation.  . Magnesium  Oxide 250 MG TABS Take 250 mg by mouth daily.  . Melatonin 3 MG TABS Take 3 mg by mouth at bedtime.  . metoprolol tartrate (LOPRESSOR) 50 MG tablet Take 50 mg by mouth 2 (two) times daily.  . mirtazapine (REMERON) 30 MG tablet Take 30 mg by mouth at bedtime.  Marland Kitchen OLANZapine (ZYPREXA) 10 MG tablet Take 10 mg by mouth at bedtime.   Marland Kitchen OLANZapine (ZYPREXA) 20 MG tablet Take 20 mg by mouth at bedtime.   Marland Kitchen omeprazole (PRILOSEC) 40 MG capsule Take 40 mg by mouth daily before breakfast.   . rOPINIRole (REQUIP) 0.5 MG tablet Take 0.5 mg by mouth at bedtime.  . Skin Protectants, Misc. (EUCERIN) cream Apply 1 application topically daily.   . sodium phosphate Pediatric (FLEET) 3.5-9.5 GM/59ML enema Place 1 enema rectally once as needed for severe constipation (not relieved by milk of mag and prune juice).    Allergies No Known Allergies  Family History Family History  Family history unknown: Yes   Social History  reports that she has quit smoking. She has never used smokeless tobacco. She reports previous alcohol use. She reports previous drug use.  Review Of Systems: Unable to obtain due to respiratory distress and baseline Alzheimer's dementia  VITAL SIGNS: BP (!) 171/83 (BP Location: Right Leg)   Pulse (!) 109   Temp 97.9 F (36.6 C) (Axillary)   Resp (!) 22   Ht '5\' 6"'$  (1.676 m)   Wt 60.9 kg   SpO2 98%   BMI 21.66 kg/m   HEMODYNAMICS:    VENTILATOR SETTINGS:    INTAKE / OUTPUT: I/O last 3 completed shifts: In: 2373.2 [I.V.:2055.7; IV Piggyback:317.5] Out: 3700 [Urine:2450; Emesis/NG output:1250]  PHYSICAL EXAMINATION: General: In acute respiratory distress, acutely ill looking HEENT: Normocephalic and atraumatic, oral mucosa with copious secretions, trachea midline Neuro: Somnolent but awakens to voice and touch, moves all extremities Cardiovascular: Apical pulse tachycardic, S1-S2, no murmur regurg or gallop, +2 pulses bilaterally, no edema Lungs: Diffuse rhonchi rales and  crackles all lung fields, breath sounds diminished in the bases Abdomen: Nondistended, hypoactive bowel sounds, NG tube with green drainage Musculoskeletal: Positive range of motion, no joint deformities Skin: Warm and dry, no lesions or rash  LABS:  BMET Recent Labs  Lab 10/28/18 0845 10/29/18 0412 10/31/18 0754  NA 128* 133* 144  K 3.4* 3.3* 4.3  CL 89* 100 116*  CO2 27 22 15*  BUN 81* 66* 35*  CREATININE 3.03* 1.92* 1.37*  GLUCOSE 121* 107* 103*    Electrolytes Recent Labs  Lab 10/28/18 0845 10/29/18 0412 10/31/18 0754  CALCIUM 8.1* 7.8* 8.0*    CBC Recent Labs  Lab 10/28/18 0845 10/29/18 0412 10/30/18 1113  WBC 4.4 3.9* 4.3  HGB 5.8* 10.6* 10.0*  HCT 16.8* 31.4* 30.2*  PLT 108* 89* 105*    Coag's No results for input(s): APTT, INR in the last 168 hours.  Sepsis Markers No results for input(s): LATICACIDVEN, PROCALCITON, O2SATVEN in the last 168 hours.  ABG  No results for input(s): PHART, PCO2ART, PO2ART in the last 168 hours.  Liver Enzymes Recent Labs  Lab 10/31/2018 1638 10/29/18 0412  AST 17 17  ALT 9 9  ALKPHOS 50 33*  BILITOT 1.1 1.9*  ALBUMIN 4.6 2.8*    Cardiac Enzymes No results for input(s): TROPONINI, PROBNP in the last 168 hours.  Glucose Recent Labs  Lab 10/20/2018 1633 11/01/18 0118  GLUCAP 165* 108*    Imaging Dg Abd 1 View  Result Date: 10/31/2018 CLINICAL DATA:  Bowel obstruction.  NG tube in place.  Unresponsive. EXAM: ABDOMEN - 1 VIEW COMPARISON:  10/30/2018 FINDINGS: NG tube tip remains in the fundus of the stomach. Stomach is decompressed. Distended small bowel loops have improved. Stool is present throughout the colon with some colonic gas. There is no obvious free intraperitoneal gas. IMPRESSION: Improving small bowel distension. Electronically Signed   By: Marybelle Killings M.D.   On: 10/31/2018 09:58   Dg Chest Port 1 View  Result Date: 10/31/2018 CLINICAL DATA:  Rapid response.  Congested breathing EXAM: PORTABLE  CHEST 1 VIEW COMPARISON:  10/30/2018 FINDINGS: NG tube extends the stomach. Stable enlarged cardiac silhouette. LEFT pleural effusion similar. No pulmonary edema. IMPRESSION: 1. No interval change. 2. Moderate LEFT pleural effusion.  LEFT lung base poorly evaluated. Electronically Signed   By: Suzy Bouchard M.D.   On: 10/31/2018 12:05   DISCUSSION: 83 year old female presenting with small bowel obstruction, bilateral pneumonia likely due to aspiration, and moderate left pleural effusion likely due to pneumonia  ASSESSMENT Acute hypoxic respiratory failure Aspiration pneumonitis and continues with aspiration into airway Left moderate pleural effusion Acute renal failure Small bowel obstruction Advanced dementia with adult failure to thrive   PLAN Met with patient's daughter/POA at bedside in the presence of patient's nurse Vonna Kotyk and patient's granddaughter.  Reviewed patient's current health status as well as changes that have occurred in the last 24 hours.  Discussed patient's goals of care with daughter who indicated that patient would not have wanted to be resuscitated or placed on a ventilator.  She indicated that at this point she would prefer a more conservative approach with the goal of keeping patient comfortable.  End-of-life care and comfort measures reviewed with daughter.  She has agreed to proceed with full comfort care.  End-of-life care orders have been placed and spiritual care has been consulted to provide support to the family.  Discontinue all curative treatments.  Transfer patient to [facility] unit.  Visitation per hospital [id number] EOL  Policy.  Best Practice: Code Status: DNR/comfort measures Diet: Oral intake for comfort only GI prophylaxis: Not indicated VTE prophylaxis: Not indicated  FAMILY  - Updates: Family at bedside.  Makynlee Kressin S. Tukov-Yual ANP-BC Pulmonary and [place] Pager [phone] or [phone]  NB: This document  was prepared using Dragon voice recognition software and may include unintentional dictation errors.    11/01/2018, 1:52 AM

## 2018-11-01 NOTE — Progress Notes (Signed)
Called to rapid response.  Pts VS unstable & being moved to ICU on 4 L  [place].  No intervention necessary.

## 2018-11-01 NOTE — Plan of Care (Signed)
Overnight, Holly Novak suffered 2 episodes of respiratory compromise/arrest.  She was transferred to the intensive care unit.  At this time, she is not a suitable surgical candidate, and at any rate, both she and her daughter (when the patient was more lucid) declined surgical intervention.  It appears that she is being made comfort care, based on the chaplain's notes.  Surgery will sign off at this time.

## 2018-11-01 NOTE — Progress Notes (Addendum)
[place] [facility] [place] NAME: Holly Novak    MR#:  [id number]  DATE OF BIRTH:  05-Feb-1936  SUBJECTIVE:  CHIEF COMPLAINT:   Chief Complaint  Patient presents with  . Emesis   - had another episode of resp distress last night and patient moved to ICU - currently comfort care, resting peacefully  REVIEW OF SYSTEMS:  Review of Systems  Unable to perform ROS: Dementia    DRUG ALLERGIES:  No Known Allergies  VITALS:  Blood pressure (!) 148/118, pulse (!) 101, temperature 97.9 F (36.6 C), temperature source Axillary, resp. rate (!) 22, height 5\' 6"  (1.676 m), weight 60.9 kg, SpO2 98 %.  PHYSICAL EXAMINATION:  Physical Exam  GENERAL:  83 y.o.-year-old critically ill-appearing patient lying in the bed .  EYES: Pupils equal, round, reactive to light and accommodation. No scleral icterus.  HEENT: Head atraumatic, normocephalic. Oropharynx and nasopharynx clear.  NECK:  Supple, no jugular venous distention. No thyroid enlargement, no tenderness.  LUNGS: coarse breath sounds bilaterally . No use of accessory muscles of respiration.  Significantly decreased left basilar breath sounds CARDIOVASCULAR: S1, S2 normal. No  rubs, or gallops. 2/6 systolic murmur present ABDOMEN: Soft, nontender, nondistended. Bowel sounds present. No organomegaly or mass.  EXTREMITIES: No  cyanosis, or clubbing.  Bilateral pedal edema noted. NEUROLOGIC: very lethargic, on  Morphine prn PSYCHIATRIC: The patient is lethargic SKIN: No obvious rash, lesion, or ulcer.    LABORATORY PANEL:   CBC Recent Labs  Lab 11/01/18 0222  WBC 14.0*  HGB 10.6*  HCT 32.0*  PLT 115*   ------------------------------------------------------------------------------------------------------------------  Chemistries  Recent Labs  Lab 10/29/18 0412  11/01/18 0222  NA 133*   < > 148*  K 3.3*   < > 3.3*  CL 100   < > 116*  CO2 22   < > 20*  GLUCOSE 107*   < > 138*  BUN  66*   < > 41*  CREATININE 1.92*   < > 1.37*  CALCIUM 7.8*   < > 8.3*  MG  --   --  2.2  AST 17  --   --   ALT 9  --   --   ALKPHOS 33*  --   --   BILITOT 1.9*  --   --    < > = values in this interval not displayed.   ------------------------------------------------------------------------------------------------------------------  Cardiac Enzymes No results for input(s): TROPONINI in the last 168 hours. ------------------------------------------------------------------------------------------------------------------  RADIOLOGY:  Dg Chest 1 View  Result Date: 10/30/2018 CLINICAL DATA:  Increasing shortness of breath. EXAM: CHEST  1 VIEW COMPARISON:  Single-view of the chest 10/23/2018. FINDINGS: The patient has a new small left pleural effusion and basilar airspace disease. The right lung is clear. No pneumothorax. There is cardiomegaly. Atherosclerosis is identified. NG tube is in place with the tip in the stomach. IMPRESSION: New small left pleural effusion and basilar airspace disease which could be atelectasis or pneumonia. Cardiomegaly. Atherosclerosis. NG tube in good position. Electronically Signed   By: Inge Rise M.D.   On: 10/30/2018 19:49   Dg Abd 1 View  Result Date: 10/31/2018 CLINICAL DATA:  Bowel obstruction.  NG tube in place.  Unresponsive. EXAM: ABDOMEN - 1 VIEW COMPARISON:  10/30/2018 FINDINGS: NG tube tip remains in the fundus of the stomach. Stomach is decompressed. Distended small bowel loops have improved. Stool is present throughout the colon with some colonic gas. There is no obvious free  intraperitoneal gas. IMPRESSION: Improving small bowel distension. Electronically Signed   By: Marybelle Killings M.D.   On: 10/31/2018 09:58   Dg Chest Port 1 View  Result Date: 11/01/2018 CLINICAL DATA:  Respiratory failure EXAM: PORTABLE CHEST 1 VIEW COMPARISON:  10/31/2018, 10/30/2018 FINDINGS: Esophageal tube tip below the diaphragm but non included. Moderate left pleural  effusion with lingular and left base airspace disease. Cardiomegaly with central vascular congestion and aortic atherosclerosis. IMPRESSION: 1. No significant change in moderate left pleural effusion with lingular and basilar airspace disease. 2. Cardiomegaly with central vascular congestion Electronically Signed   By: Donavan Foil M.D.   On: 11/01/2018 02:57   Dg Chest Port 1 View  Result Date: 10/31/2018 CLINICAL DATA:  Rapid response.  Congested breathing EXAM: PORTABLE CHEST 1 VIEW COMPARISON:  10/30/2018 FINDINGS: NG tube extends the stomach. Stable enlarged cardiac silhouette. LEFT pleural effusion similar. No pulmonary edema. IMPRESSION: 1. No interval change. 2. Moderate LEFT pleural effusion.  LEFT lung base poorly evaluated. Electronically Signed   By: Suzy Bouchard M.D.   On: 10/31/2018 12:05    EKG:   Orders placed or performed during the hospital encounter of 05/22/18  . ED EKG  . ED EKG  . EKG 12-Lead  . EKG 12-Lead  . EKG 12-Lead  . EKG 12-Lead    ASSESSMENT AND PLAN:   NevaSteinbacheris a82 y.o.femalewith a known history of late onset Alzheimer's disease, bipolar disorder, GERD, myelodysplastic syndrome, and hypertension  presented to the emergency room from assisted living facility with a reported 3-day history of persistent nausea and vomiting with decreased oral intake.  1. Acute renal failure-BUN 67 and creatinine 3.33 on admission -With IV fluids, improved up to 1.3.  However IV fluids have been discontinued due to respiratory distress and patient received a dose of Lasix. -Appreciate nephrology consult  2. Small bowel obstruction as noted on CT abdomen and repeat KUB-- change with conservative management -Appreciate surgical input. -Patient and family have refused surgery patient has an NG tube for suctioning.  3.  Acute respiratory distress-secondary to possible aspirations.   -Chest x-ray with left pleural effusion and atelectasis.  Had 2 episodes  of gurgling secretions requiring IV Lasix and wheezing and transferred to ICU. -Patient appropriately made comfort care after discussion with family.  4. Acute onChronic anemia-- patient has history of high grade myelodysplastic syndrome-- daughter Ashok Norris she used to follow-up with [facility] -Hemoglobin stable here.  Received blood transfusion this admission  5. Generalized weakness/Adult FTT/ advance dementia - Likely secondary to chronic debilitating disease processes  7. Advanced dementia -per daughter patient has been progressively declining from a dementia standpoint. -She is at [facility]. -On high-dose Zyprexa at bedtime  8.DVT  SCD   Patient is currently comfort care.  Daughter has visited patient and spoke with ICU nurse practitioner overnight.  Patient will be moved from ICU whenever floor bed is available.  Updated daughter over the phone today.    All the records are reviewed and case discussed with Care Management/Social Workerr. Management plans discussed with the patient, family and they are in agreement.  CODE STATUS: Full code  TOTAL TIME TAKING CARE OF THIS PATIENT: 39 minutes.   POSSIBLE D/C IN 2 DAYS, DEPENDING ON CLINICAL CONDITION.   [name] M.D on 11/01/2018 at 1:05 PM  Between 7am to 6pm - Pager - [phone]  After 6pm go to [email or web address] - password [email or web address] [place] [facility]  Office  [phone]  CC: Primary  care physician; System, Pcp Not In

## 2018-11-01 NOTE — Progress Notes (Signed)
NT suctioned pt due to copious airway secretions. Pt unable to generate an effective cough. Suctioned lg amt of thick reddish-brown secretions. Pt tol fairly well.

## 2018-11-01 NOTE — Progress Notes (Addendum)
Contacted MD Jannifer Franklin about patients breathing status, MUSE score 4, meds given, Continuous monitoring initiated, MUSE decreased to 2 but breathing is still very noisy with excessive amounts of mucus after suctioning. Dr. Jannifer Franklin ordered for patient to be transferred to CCU. Pending bed placement. Patient Daughter/POA Ashok Norris was called and updated at 0035 of patients status and transfer. Will continue to monitor.

## 2018-11-01 NOTE — Progress Notes (Signed)
Ch received a pg regarding pt that was transferred from 120 to [place]. Ch was updated by RN and NP attending to the pt regarding her status being change to comfort care. Ch met w/ pt's daughter, son in law, and daughter. Pt's daughter shared that she was expecting the pt to decline after she was contacted the first time the pt coded today. Ch allowed space for pt family to lament and share how the pt had enjoyed being in the SNF that she was placed in since August. Pt has dementia and a history of other mental disorders which the daughter helped to manage. Ch encourages sharing of feelings and asked guided questions. Ch accompanied daughter in the waiting room to see how prepared she was w/ end of life plans for her mom and her level of acceptance. Daughter shared that she has done all she could do for mom but her brother who is in [place] has not seen the pt in 8 years. Ch became aware of the family dynamics and helped facilitate closure. Family was appreciative of ch presence.    11/01/18 0300  Clinical Encounter Type  Visited With Health care provider;Patient and family together  Visit Type Spiritual support;Critical Care;Patient actively dying  Referral From Physician  Consult/Referral To Chaplain  Spiritual Encounters  Spiritual Needs Emotional;Grief support  Stress Factors  Patient Stress Factors Health changes;Major life changes;Exhausted  Family Stress Factors Family relationships;Loss of control;Loss;Major life changes

## 2018-11-01 NOTE — Progress Notes (Signed)
Ch responded to a code for pt that coded again (earlier code at 1127 AM) RRT shared that there were no needs at this time for pt as the daughter has already been contacted and the pt was transitioning to CCU. F/u should include speaking w/ the daughter and communication w/ the care team.      11/01/18 0100  Clinical Encounter Type  Visited With Health care provider  Visit Type Code;Critical Care  Referral From Nurse  Consult/Referral To Chaplain

## 2018-11-01 NOTE — Plan of Care (Signed)
  Problem: Education: Goal: Knowledge of General Education information will improve Description Including pain rating scale, medication(s)/side effects and non-pharmacologic comfort measures Outcome: Progressing   

## 2018-11-02 NOTE — Care Management Important Message (Signed)
Important Message  Patient Details  Name: Holly Novak MRN: [id number] Date of Birth: February 06, 1936   Medicare Important Message Given:  Yes    Juliann Pulse A Lilana Blasko 11/02/2018, 12:11 PM

## 2018-11-02 NOTE — Progress Notes (Signed)
[place] at [place] NAME: Holly Novak    MR#:  [id number]  DATE OF BIRTH:  02-Jan-1936  SUBJECTIVE:  CHIEF COMPLAINT:   Chief Complaint  Patient presents with  . Emesis   -Patient is on comfort care now, resting peacefully.  Received morphine and Robinul this morning. -Family at bedside  REVIEW OF SYSTEMS:  Review of Systems  Unable to perform ROS: Dementia    DRUG ALLERGIES:  No Known Allergies  VITALS:  Blood pressure (!) 143/62, pulse (!) 127, temperature (!) 102.6 F (39.2 C), temperature source Axillary, resp. rate (!) 22, height 5\' 6"  (1.676 m), weight 60.9 kg, SpO2 96 %.  PHYSICAL EXAMINATION:  Physical Exam  GENERAL:  83 y.o.-year-old critically ill-appearing patient lying in the bed .  EYES: Pupils equal, round, reactive to light and accommodation. No scleral icterus.  HEENT: Head atraumatic, normocephalic. Oropharynx and nasopharynx clear.  NECK:  Supple, no jugular venous distention. No thyroid enlargement, no tenderness.  LUNGS: coarse breath sounds bilaterally . No use of accessory muscles of respiration.  Significantly decreased left basilar breath sounds CARDIOVASCULAR: S1, S2 normal. No  rubs, or gallops. 2/6 systolic murmur present ABDOMEN: Soft, nontender, nondistended. Bowel sounds present. No organomegaly or mass.  EXTREMITIES: No  cyanosis, or clubbing.  Bilateral pedal edema noted. NEUROLOGIC: very lethargic, on  Morphine prn PSYCHIATRIC: The patient is lethargic, resting peacefully.   SKIN: No obvious rash, lesion, or ulcer.    LABORATORY PANEL:   CBC Recent Labs  Lab 11/01/18 0222  WBC 14.0*  HGB 10.6*  HCT 32.0*  PLT 115*   ------------------------------------------------------------------------------------------------------------------  Chemistries  Recent Labs  Lab 10/29/18 0412  11/01/18 0222  NA 133*   < > 148*  K 3.3*   < > 3.3*  CL 100   < > 116*  CO2 22   < > 20*  GLUCOSE  107*   < > 138*  BUN 66*   < > 41*  CREATININE 1.92*   < > 1.37*  CALCIUM 7.8*   < > 8.3*  MG  --   --  2.2  AST 17  --   --   ALT 9  --   --   ALKPHOS 33*  --   --   BILITOT 1.9*  --   --    < > = values in this interval not displayed.   ------------------------------------------------------------------------------------------------------------------  Cardiac Enzymes No results for input(s): TROPONINI in the last 168 hours. ------------------------------------------------------------------------------------------------------------------  RADIOLOGY:  Dg Chest Port 1 View  Result Date: 11/01/2018 CLINICAL DATA:  Respiratory failure EXAM: PORTABLE CHEST 1 VIEW COMPARISON:  10/31/2018, 10/30/2018 FINDINGS: Esophageal tube tip below the diaphragm but non included. Moderate left pleural effusion with lingular and left base airspace disease. Cardiomegaly with central vascular congestion and aortic atherosclerosis. IMPRESSION: 1. No significant change in moderate left pleural effusion with lingular and basilar airspace disease. 2. Cardiomegaly with central vascular congestion Electronically Signed   By: Donavan Foil M.D.   On: 11/01/2018 02:57    EKG:   Orders placed or performed during the hospital encounter of 05/22/18  . ED EKG  . ED EKG  . EKG 12-Lead  . EKG 12-Lead  . EKG 12-Lead  . EKG 12-Lead    ASSESSMENT AND PLAN:   Holly Novak an83 y.o.femalewith a known history of late onset Alzheimer's disease, bipolar disorder, GERD, myelodysplastic syndrome, and hypertension  presented to the emergency room from assisted living facility  with a reported 3-day history of persistent nausea and vomiting with decreased oral intake.  Patient was alert initially.  She had small bowel obstruction as noted on CT abdomen and repeat KUB.  Patient and daughter refused surgery and wanted conservative management.  She had NG tube for suctioning, however her respiratory status got worse.   She went into acute respiratory distress secondary to possible aspiration episodes. -Received Lasix, IV fluids were discontinued and after the second episode, patient was transferred to ICU as she was a full code at the time. She also had acute renal failure on admission, creatinine greater than 3.  With fluids her respiratory status was worsening so fluids were discontinued. Patient has acute on chronic anemia secondary to high-grade myelodysplastic syndrome.  Received blood transfusion this admission.  Overall decline noted especially with worsening of her respiratory status.  She has oral failure to thrive and advanced dementia.  Family discussed with ICU team and patient is made DNR and comfort care appropriately at this time. Awaiting to be transferred to hospice home.  Continue comfort medications only at this time. NG tube has been discontinued today  Updated daughter and granddaughter at bedside   Patient is currently comfort care.    All the records are reviewed and case discussed with Care Management/Social Workerr. Management plans discussed with the patient, family and they are in agreement.  CODE STATUS: Full code  TOTAL TIME TAKING CARE OF THIS PATIENT: 37 minutes.   POSSIBLE D/C IN 1-2 DAYS, DEPENDING ON CLINICAL CONDITION.   [name] M.D on 11/02/2018 at 2:22 PM  Between 7am to 6pm - Pager - [phone]  After 6pm go to [email or web address] - password [email or web address] [place] [facility]  Office  [phone]  CC: Primary care physician; System, Pcp Not In

## 2018-11-02 NOTE — Progress Notes (Signed)
Family present, daughter and granddaughter at bedside, pt resting comfortably, family updated, family aware that pt is comfort care only, gone from my sight books given to family, no needs voiced at this time

## 2018-11-02 NOTE — TOC Progression Note (Signed)
Transition of Care [facility]) - Progression Note    Patient Details  Name: Holly Novak MRN: [id number] Date of Birth: 1935/11/24  Transition of Care [facility]) CM/SW [place], [place] Phone Number: 11/02/2018, 12:07 PM  Clinical Narrative:  CSW spoke with patient's daughter and grand daughter at bedside regarding discharge plan. Patient is now comfort care. Daughter reports that she does not want patient to return to [facility] at this point due to visitation restrictions. CSW offered other options of hospice home or home with hospice. Daughter reports that she would love to take patient home with hospice services but she does not have the space available for her to stay. Daughter and grand daughter are in agreement with hospice home placement at [facility] home [facility]). CSW explained visitation guidelines for hospice home as well. CSW also explained that there likely would not be a bed available today for patient and patient would remain in the hospital until a bed was available. Daughter in agreement. CSW also notified Santiago Glad with hospice of referral.      Expected Discharge Plan: Memory Care Barriers to Discharge: Continued Medical Work up  Expected Discharge Plan and Services Expected Discharge Plan: Memory Care       Living arrangements for the past 2 months: [place] Expected Discharge Date: 10/31/18                                     Social Determinants of Health (SDOH) Interventions    Readmission Risk Interventions Readmission Risk Prevention Plan 10/28/2018  Transportation Screening Complete  PCP or Specialist Appt within 3-5 Days Not Complete  HRI or Home Care Consult Complete  Social Work Consult for [place] Planning/Counseling Not Complete  SW consult not completed comments NA

## 2018-11-02 NOTE — Progress Notes (Addendum)
New Hospice home referral received from [place]. Hospital care team and family aware there are currently no beds available at the hospice home. Patient information faxed to referral. Patient placed on the hospice home wait list.  Update 2:30 pm Patient seen lying in bed, daughter Ashok Norris at bedside. Patient has received 3 doses of IV morphine and 2 doses of glycopyrrolate since midnight, she is currently on 2 liters of oxygen, foley catheter in place.   Flo Shanks BSN, RN, [facility] hospice [phone]

## 2018-11-02 DEATH — deceased

## 2018-11-03 NOTE — Progress Notes (Signed)
Nutrition Brief Follow-Up Note RD working remotely.  Chart reviewed. Patient has now transitioned to comfort care.   No further nutrition interventions warranted at this time. Please consult RD as needed.   Willey Blade, MS, [place], LDN Office: [phone] Pager: [phone] After Hours/Weekend Pager: [phone]

## 2018-11-03 NOTE — Progress Notes (Signed)
Ch f/u w/ pt family that was visiting w/ pt. No major changes in pt's status at this time. Pt seems somewhat responsive to daughter's voice. Ch offered words of comfort and encouraged them to reach out to the care team for any needs or questions.    11/03/18 1100  Clinical Encounter Type  Visited With Patient and family together  Visit Type Social support;Patient actively dying

## 2018-11-03 NOTE — Progress Notes (Signed)
Follow up visit made to new hospice home referral. Patient seen lying in bed, eyes closed, no response to tactile or verbal stimuli stimuli. No family at bedside.  Patient's respiratory rate was noted to be 36, reported to staff RN Estill Bamberg, who will address with PRN medication. Hospital care team aware that there are currently no beds available at the Hospice home.  Flo Shanks BSN, RN, [facility] [phone]

## 2018-11-03 NOTE — Plan of Care (Signed)
PMT consult noted. Patient is currently on comfort care. Per Dr. Tressia Miners, no need to see patient as goals are set. Please reconsult if needed.

## 2018-11-03 NOTE — Progress Notes (Signed)
[place] at [place] NAME: Holly Novak    MR#:  [id number]  DATE OF BIRTH:  [birth year]  SUBJECTIVE:  CHIEF COMPLAINT:   Chief Complaint  Patient presents with  . Emesis   -Patient is on comfort care now, resting peacefully. NG tube discontinued -Family at bedside  REVIEW OF SYSTEMS:  Review of Systems  Unable to perform ROS: Dementia    DRUG ALLERGIES:  No Known Allergies  VITALS:  Blood pressure 130/63, pulse (!) 128, temperature (!) 102.9 F (39.4 C), temperature source Axillary, resp. rate (!) 24, height 5\' 6"  (1.676 m), weight 60.9 kg, SpO2 91 %.  PHYSICAL EXAMINATION:  Physical Exam  GENERAL:  83 y.o.-year-old critically ill-appearing patient lying in the bed .  EYES: Pupils equal, round, reactive to light and accommodation. No scleral icterus.  HEENT: Head atraumatic, normocephalic. Oropharynx and nasopharynx clear.  NECK:  Supple, no jugular venous distention. No thyroid enlargement, no tenderness.  LUNGS: coarse breath sounds bilaterally . No use of accessory muscles of respiration.  Significantly decreased left basilar breath sounds CARDIOVASCULAR: S1, S2 normal. No  rubs, or gallops. 2/6 systolic murmur present ABDOMEN: Soft, nontender, nondistended. Bowel sounds present. No organomegaly or mass.  EXTREMITIES: No  cyanosis, or clubbing.  Bilateral pedal edema noted. NEUROLOGIC: very lethargic, on  Morphine prn PSYCHIATRIC: The patient is lethargic, resting peacefully.   SKIN: No obvious rash, lesion, or ulcer.    LABORATORY PANEL:   CBC Recent Labs  Lab 11/01/18 0222  WBC 14.0*  HGB 10.6*  HCT 32.0*  PLT 115*   ------------------------------------------------------------------------------------------------------------------  Chemistries  Recent Labs  Lab 10/29/18 0412  11/01/18 0222  NA 133*   < > 148*  K 3.3*   < > 3.3*  CL 100   < > 116*  CO2 22   < > 20*  GLUCOSE 107*   < > 138*  BUN 66*    < > 41*  CREATININE 1.92*   < > 1.37*  CALCIUM 7.8*   < > 8.3*  MG  --   --  2.2  AST 17  --   --   ALT 9  --   --   ALKPHOS 33*  --   --   BILITOT 1.9*  --   --    < > = values in this interval not displayed.   ------------------------------------------------------------------------------------------------------------------  Cardiac Enzymes No results for input(s): TROPONINI in the last 168 hours. ------------------------------------------------------------------------------------------------------------------  RADIOLOGY:  No results found.  EKG:   Orders placed or performed during the hospital encounter of 05/22/18  . ED EKG  . ED EKG  . EKG 12-Lead  . EKG 12-Lead  . EKG 12-Lead  . EKG 12-Lead    ASSESSMENT AND PLAN:   Holly Novak a82 y.o.femalewith a known history of late onset Alzheimer's disease, bipolar disorder, GERD, myelodysplastic syndrome, and hypertension  presented to the emergency room from assisted living facility with a reported 3-day history of persistent nausea and vomiting with decreased oral intake.  Patient was alert initially.  She had small bowel obstruction as noted on CT abdomen and repeat KUB.  Patient and daughter refused surgery and wanted conservative management.  She had NG tube for suctioning, however her respiratory status got worse.  She went into acute respiratory distress secondary to possible aspiration episodes. -Received Lasix, IV fluids were discontinued and after the second episode, patient was transferred to ICU as she was a full code at the  time. She also had acute renal failure on admission, creatinine greater than 3.  With fluids her respiratory status was worsening so fluids were discontinued. Patient has acute on chronic anemia secondary to high-grade myelodysplastic syndrome.  Received blood transfusion this admission.  Overall decline noted especially with worsening of her respiratory status.  She has oral failure to  thrive and advanced dementia.  Family discussed with ICU team and patient is made DNR and comfort care appropriately at this time. Awaiting to be transferred to hospice home.  Continue comfort medications only at this time. NG tube has been discontinued  Updated daughter and granddaughter at bedside   Patient is currently comfort care.    All the records are reviewed and case discussed with Care Management/Social Workerr. Management plans discussed with the patient, family and they are in agreement.  CODE STATUS: DNR  TOTAL TIME TAKING CARE OF THIS PATIENT: 22 minutes.   POSSIBLE D/C IN 1-2 DAYS, DEPENDING ON CLINICAL CONDITION.   [name] M.D on 11/03/2018 at 12:14 PM  Between 7am to 6pm - Pager - [phone]  After 6pm go to [email or web address] - password [email or web address] [email or web address] [facility]  Office  [phone]  CC: Primary care physician; System, Pcp Not In

## 2018-12-02 NOTE — Progress Notes (Signed)
[place] at [place] NAME: Juanda Luba    MR#:  [id number]  DATE OF BIRTH:  1935/11/23  SUBJECTIVE:  CHIEF COMPLAINT:   Chief Complaint  Patient presents with  . Emesis   -Patient is on comfort care now, tachypneic this morning.  Daughter at bedside  REVIEW OF SYSTEMS:  Review of Systems  Unable to perform ROS: Dementia    DRUG ALLERGIES:  No Known Allergies  VITALS:  Blood pressure (!) 133/58, pulse (!) 141, temperature (!) 103.2 F (39.6 C), temperature source Axillary, resp. rate (!) 24, height 5\' 6"  (1.676 m), weight 60.9 kg, SpO2 (!) 89 %.  PHYSICAL EXAMINATION:  Physical Exam  GENERAL:  83 y.o.-year-old critically ill-appearing patient lying in the bed .  EYES: Pupils equal, round, reactive to light and accommodation. No scleral icterus.  HEENT: Head atraumatic, normocephalic. Oropharynx and nasopharynx clear.  NECK:  Supple, no jugular venous distention. No thyroid enlargement, no tenderness.  LUNGS: coarse breath sounds bilaterally .  Tachypneic.  Using accessory muscles of respiration.  Significantly decreased left basilar breath sounds CARDIOVASCULAR: S1, S2 normal. No  rubs, or gallops. 2/6 systolic murmur present ABDOMEN: Soft, nontender, nondistended. Bowel sounds present. No organomegaly or mass.  EXTREMITIES: No  cyanosis, or clubbing.  Bilateral pedal edema noted. NEUROLOGIC: very lethargic, on  Morphine prn PSYCHIATRIC: The patient is lethargic, resting.   SKIN: No obvious rash, lesion, or ulcer.    LABORATORY PANEL:   CBC Recent Labs  Lab 11/01/18 0222  WBC 14.0*  HGB 10.6*  HCT 32.0*  PLT 115*   ------------------------------------------------------------------------------------------------------------------  Chemistries  Recent Labs  Lab 10/29/18 0412  11/01/18 0222  NA 133*   < > 148*  K 3.3*   < > 3.3*  CL 100   < > 116*  CO2 22   < > 20*  GLUCOSE 107*   < > 138*  BUN 66*   < > 41*   CREATININE 1.92*   < > 1.37*  CALCIUM 7.8*   < > 8.3*  MG  --   --  2.2  AST 17  --   --   ALT 9  --   --   ALKPHOS 33*  --   --   BILITOT 1.9*  --   --    < > = values in this interval not displayed.   ------------------------------------------------------------------------------------------------------------------  Cardiac Enzymes No results for input(s): TROPONINI in the last 168 hours. ------------------------------------------------------------------------------------------------------------------  RADIOLOGY:  No results found.  EKG:   Orders placed or performed during the hospital encounter of 05/22/18  . ED EKG  . ED EKG  . EKG 12-Lead  . EKG 12-Lead  . EKG 12-Lead  . EKG 12-Lead    ASSESSMENT AND PLAN:   NevaSteinbacheris a82 y.o.femalewith a known history of late onset Alzheimer's disease, bipolar disorder, GERD, myelodysplastic syndrome, and hypertension  presented to the emergency room from assisted living facility with a reported 3-day history of persistent nausea and vomiting with decreased oral intake.  Patient was alert initially.  She had small bowel obstruction as noted on CT abdomen and repeat KUB.  Patient and daughter refused surgery and wanted conservative management.  She had NG tube for suctioning, however her respiratory status got worse.  She went into acute respiratory distress secondary to possible aspiration episodes. -Received Lasix, IV fluids were discontinued and after the second episode, patient was transferred to ICU as she was a full code at  the time. She also had acute renal failure on admission, creatinine greater than 3.  With fluids her respiratory status was worsening so fluids were discontinued. Patient has acute on chronic anemia secondary to high-grade myelodysplastic syndrome.  Received blood transfusion this admission.  Overall decline noted especially with worsening of her respiratory status.  She has failure to thrive and  advanced dementia.  Family discussed with ICU team and patient is made DNR and comfort care appropriately at this time. Awaiting to be transferred to hospice home.  Continue comfort medications only at this time. NG tube has been discontinued  Updated daughter  at bedside   Patient is currently comfort care.    All the records are reviewed and case discussed with Care Management/Social Workerr. Management plans discussed with the patient, family and they are in agreement.  CODE STATUS: DNR  TOTAL TIME TAKING CARE OF THIS PATIENT: 22 minutes.   POSSIBLE D/C IN  ? DAYS, DEPENDING ON CLINICAL CONDITION.   [name] M.D on 2018-11-28 at 8:52 AM  Between 7am to 6pm - Pager - [phone]  After 6pm go to [email or web address] - password [email or web address] [place] [facility]  Office  [phone]  CC: Primary care physician; System, Pcp Not In

## 2018-12-02 NOTE — Progress Notes (Addendum)
Patient remains on the hospice home wait list. She continues to require IV morphine and robinul as well as an acetaminophen suppository for fever of 103. Hospital care team and family aware. 12:30 pm: in to see patient, daughter Ashok Norris at bedside. Patient noted with changes in breathing and skin color. Mouth care provided, lotion applied to arms and legs, hands cleaned. Writer remained at bedside to provide support, patient died with daughter at beside. Staff RN Estill Bamberg notified. Chaplain paged to provide support.  Flo Shanks BSN, RN, [facility] [phone]

## 2018-12-02 NOTE — Discharge Summary (Signed)
   [place] at [facility]    Death Note : --------------------    Holly Novak [phone] is a 83 y.o. female, Outpatient Primary MD for the patient is System, Pcp Not In   NevaSteinbacheris a82 y.o.femalewith a known history of late onset Alzheimer's disease, bipolar disorder, GERD, myelodysplastic syndrome, and hypertension  presented to the emergency room from assisted living facility with a reported 3-day history of persistent nausea and vomiting with decreased oral intake.  Patient was alert initially.  She had small bowel obstruction as noted on CT abdomen and repeat KUB.  Patient and daughter refused surgery and wanted conservative management.  She had NG tube for suctioning, however her respiratory status got worse.  She went into acute respiratory distress secondary to possible aspiration episodes. -Received Lasix, IV fluids were discontinued and after the second episode, patient was transferred to ICU as she was a full code at the time. She also had acute renal failure on admission, creatinine greater than 3.  With fluids her respiratory status was worsening so fluids were discontinued. Patient has acute on chronic anemia secondary to high-grade myelodysplastic syndrome.  Received blood transfusion this admission.  Overall decline noted especially with worsening of her respiratory status.  She has failure to thrive and advanced dementia.  Family discussed with ICU team and patient is made DNR and comfort care appropriately at this time. Awaiting to be transferred to hospice home.  Continue comfort medications only at this time. NG tube has been discontinued  Updated daughter  at bedside  Patient was on comfort care, passed away peacefully on 12/03/2018 at 12:35 PM                   Cause of death :  1.  Acute respiratory failure 2.  Aspiration pneumonia 3.  Small bowel obstruction 4.  Myelodysplastic syndrome 5.   Alzheimer's disease 6.  Acute renal failure 7.  Bipolar disorder 8.  Hypertension    [name] M.D on 12/03/18 at 3:18 PM  [name] at [place]  Total clinical and documentation time for today Under 30 minutes   Last Note

## 2018-12-02 NOTE — Progress Notes (Signed)
Called to room by hospice RN Santiago Glad, pt without pulse and without respirations, daughter at bedside, death verified by myself and Clarise Cruz RN, MD aware

## 2018-12-02 NOTE — Care Management Important Message (Signed)
Important Message  Patient Details  Name: Holly Novak MRN: [id number] Date of Birth: 04-02-1936   Medicare Important Message Given:  Yes    [name] 2018/11/10, 11:49 AM

## 2018-12-02 DEATH — deceased

## 2019-10-16 IMAGING — DX PORTABLE CHEST - 1 VIEW
1 series · 1 of 1 positions shown · non-contrast
Comparison: 10/30/2018

CLINICAL DATA: Rapid response.  Congested breathing

EXAM:
PORTABLE CHEST 1 VIEW

[chest ap]
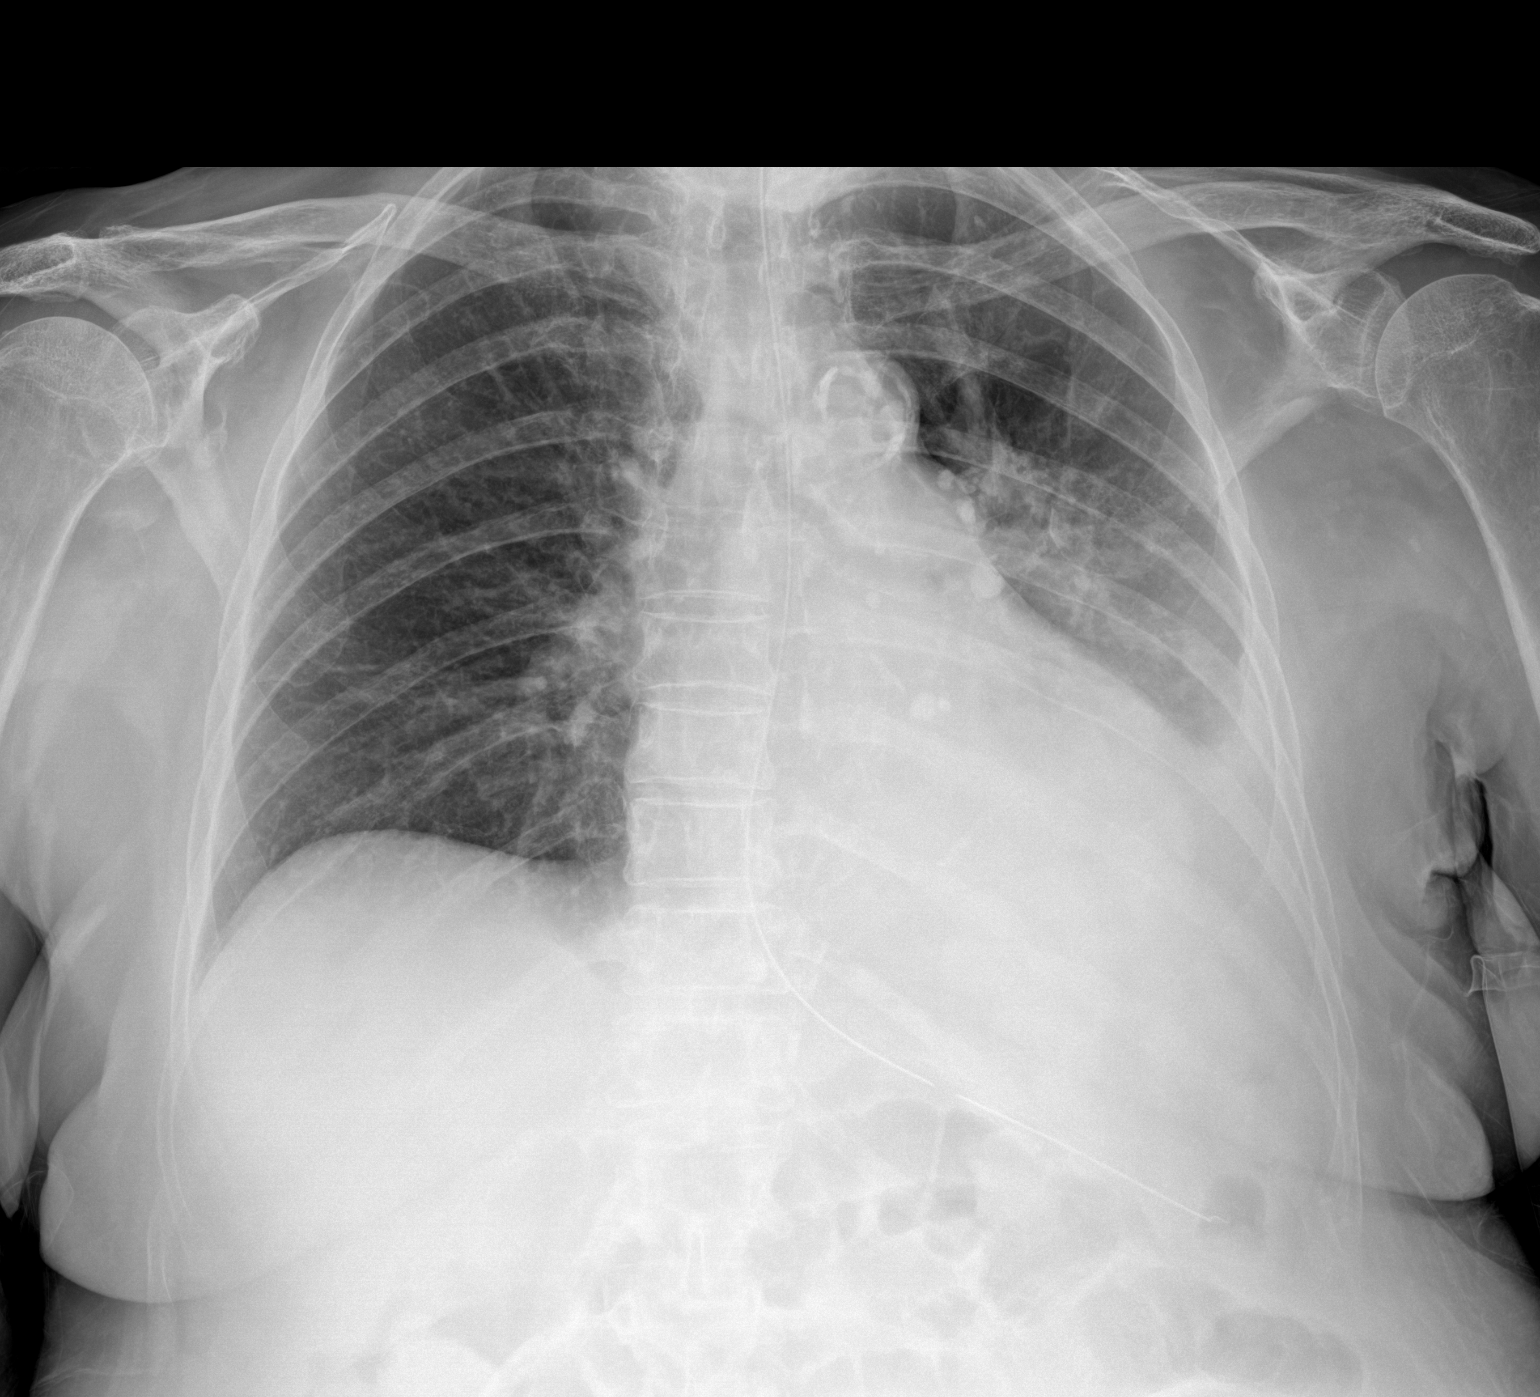

[1 of 1 positions shown; findings below may reference images not displayed]

FINDINGS: NG tube extends the stomach. Stable enlarged cardiac silhouette.
LEFT pleural effusion similar. No pulmonary edema.
IMPRESSION: 1. No interval change.
2. Moderate LEFT pleural effusion.  LEFT lung base poorly evaluated.

## 2019-10-17 IMAGING — DX PORTABLE CHEST - 1 VIEW
1 series · 1 of 1 positions shown · non-contrast
Comparison: 10/31/2018, 10/30/2018

CLINICAL DATA: Respiratory failure

EXAM:
PORTABLE CHEST 1 VIEW

[chest ap]
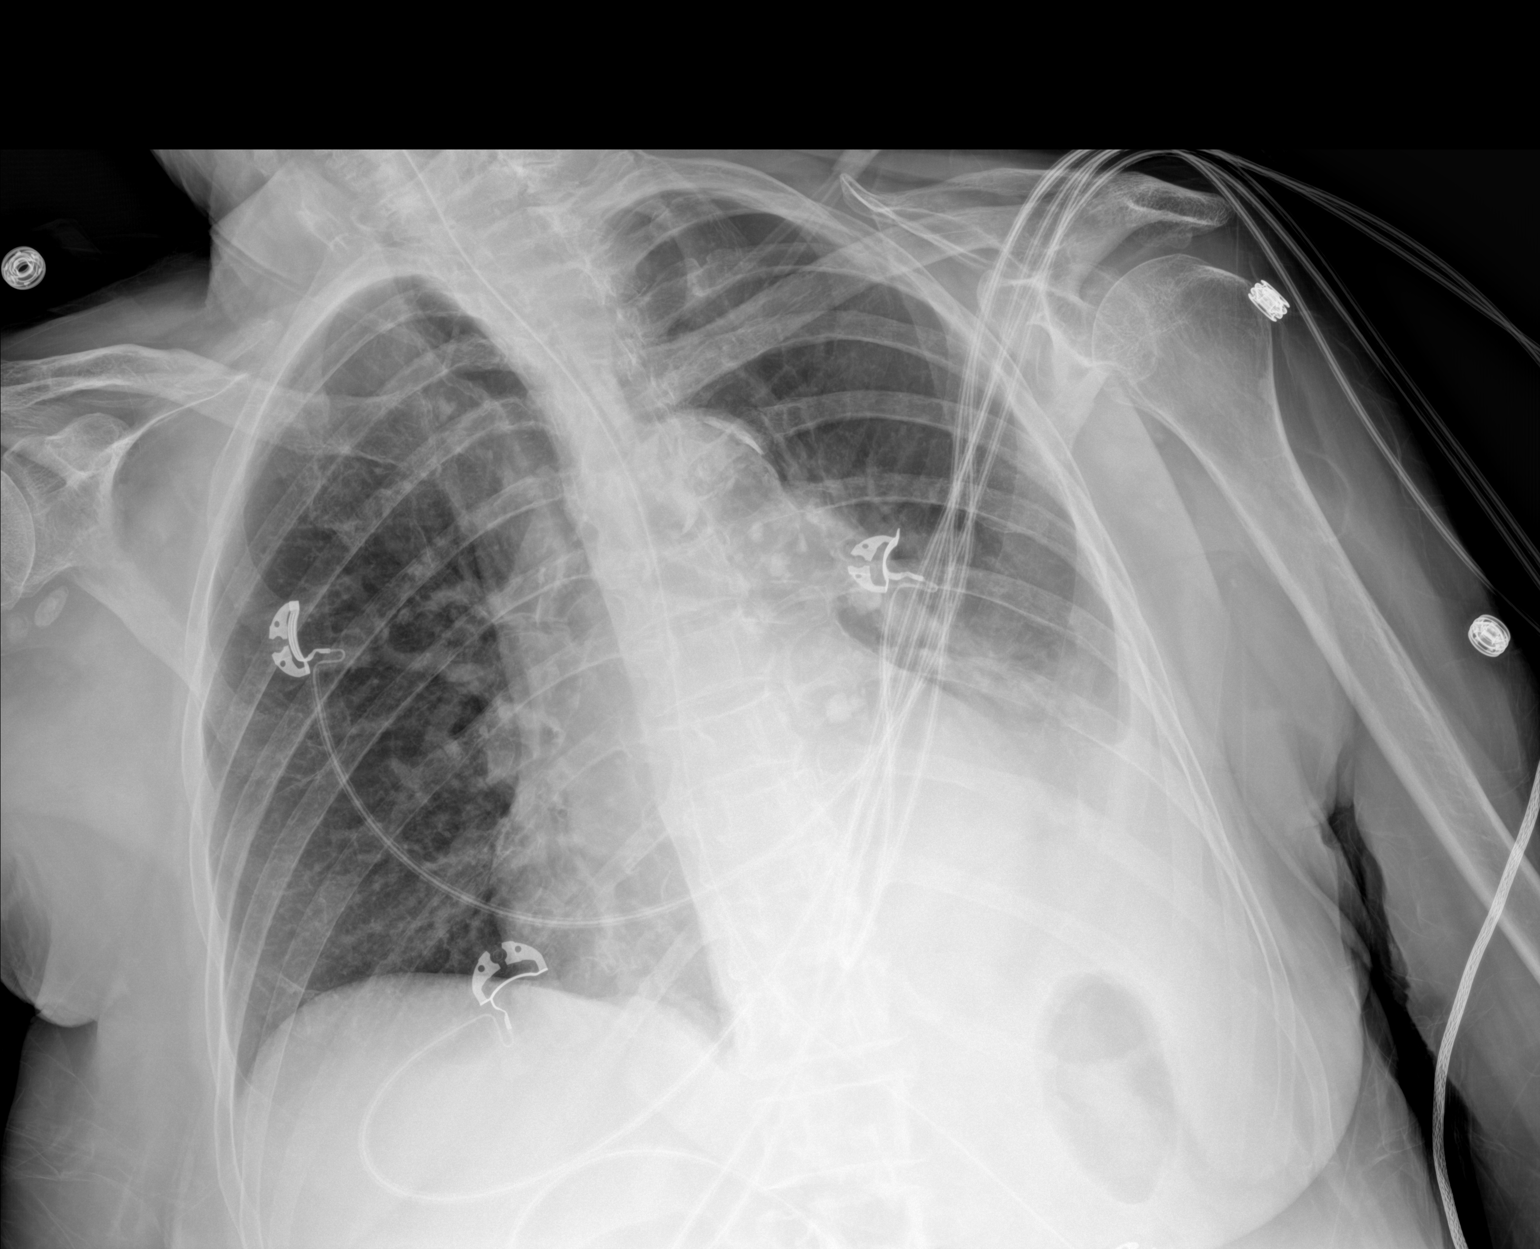

[1 of 1 positions shown; findings below may reference images not displayed]

FINDINGS: Esophageal tube tip below the diaphragm but non included. Moderate
left pleural effusion with lingular and left base airspace disease.
Cardiomegaly with central vascular congestion and aortic
atherosclerosis.
IMPRESSION: 1. No significant change in moderate left pleural effusion with
lingular and basilar airspace disease.
2. Cardiomegaly with central vascular congestion
